# Patient Record
Sex: Male | Born: 1940 | Race: White | Hispanic: No | Smoking: Former smoker
Health system: Southern US, Community
[De-identification: ages and names within clinical notes are randomized; demographics above are authoritative.]

## PROBLEM LIST (undated history)

## (undated) DIAGNOSIS — R51 Headache: Secondary | ICD-10-CM

## (undated) DIAGNOSIS — M25519 Pain in unspecified shoulder: Secondary | ICD-10-CM

## (undated) DIAGNOSIS — M1990 Unspecified osteoarthritis, unspecified site: Secondary | ICD-10-CM

## (undated) DIAGNOSIS — K219 Gastro-esophageal reflux disease without esophagitis: Secondary | ICD-10-CM

## (undated) DIAGNOSIS — G8929 Other chronic pain: Secondary | ICD-10-CM

## (undated) DIAGNOSIS — N529 Male erectile dysfunction, unspecified: Secondary | ICD-10-CM

## (undated) DIAGNOSIS — G44209 Tension-type headache, unspecified, not intractable: Secondary | ICD-10-CM

## (undated) DIAGNOSIS — N138 Other obstructive and reflux uropathy: Secondary | ICD-10-CM

## (undated) DIAGNOSIS — N401 Enlarged prostate with lower urinary tract symptoms: Secondary | ICD-10-CM

## (undated) DIAGNOSIS — D126 Benign neoplasm of colon, unspecified: Secondary | ICD-10-CM

## (undated) DIAGNOSIS — E559 Vitamin D deficiency, unspecified: Secondary | ICD-10-CM

## (undated) DIAGNOSIS — K648 Other hemorrhoids: Secondary | ICD-10-CM

## (undated) DIAGNOSIS — H9319 Tinnitus, unspecified ear: Secondary | ICD-10-CM

## (undated) DIAGNOSIS — F419 Anxiety disorder, unspecified: Secondary | ICD-10-CM

## (undated) DIAGNOSIS — M2550 Pain in unspecified joint: Secondary | ICD-10-CM

## (undated) DIAGNOSIS — R41840 Attention and concentration deficit: Secondary | ICD-10-CM

## (undated) DIAGNOSIS — K589 Irritable bowel syndrome without diarrhea: Secondary | ICD-10-CM

## (undated) DIAGNOSIS — G4700 Insomnia, unspecified: Secondary | ICD-10-CM

## (undated) DIAGNOSIS — K449 Diaphragmatic hernia without obstruction or gangrene: Secondary | ICD-10-CM

## (undated) DIAGNOSIS — E785 Hyperlipidemia, unspecified: Secondary | ICD-10-CM

## (undated) DIAGNOSIS — K2210 Ulcer of esophagus without bleeding: Secondary | ICD-10-CM

## (undated) DIAGNOSIS — F329 Major depressive disorder, single episode, unspecified: Secondary | ICD-10-CM

## (undated) HISTORY — DX: Tension-type headache, unspecified, not intractable: G44.209

## (undated) HISTORY — PX: ROTATOR CUFF REPAIR: SHX139

## (undated) HISTORY — DX: Major depressive disorder, single episode, unspecified: F32.9

## (undated) HISTORY — DX: Tinnitus, unspecified ear: H93.19

## (undated) HISTORY — DX: Male erectile dysfunction, unspecified: N52.9

## (undated) HISTORY — DX: Pain in unspecified joint: M25.50

## (undated) HISTORY — DX: Other hemorrhoids: K64.8

## (undated) HISTORY — DX: Attention and concentration deficit: R41.840

## (undated) HISTORY — DX: Insomnia, unspecified: G47.00

## (undated) HISTORY — DX: Diaphragmatic hernia without obstruction or gangrene: K44.9

## (undated) HISTORY — DX: Irritable bowel syndrome, unspecified: K58.9

## (undated) HISTORY — DX: Benign prostatic hyperplasia with lower urinary tract symptoms: N40.1

## (undated) HISTORY — DX: Vitamin D deficiency, unspecified: E55.9

## (undated) HISTORY — DX: Hyperlipidemia, unspecified: E78.5

## (undated) HISTORY — DX: Unspecified osteoarthritis, unspecified site: M19.90

## (undated) HISTORY — DX: Gastro-esophageal reflux disease without esophagitis: K21.9

## (undated) HISTORY — DX: Other chronic pain: G89.29

## (undated) HISTORY — DX: Benign neoplasm of colon, unspecified: D12.6

## (undated) HISTORY — DX: Ulcer of esophagus without bleeding: K22.10

## (undated) HISTORY — DX: Anxiety disorder, unspecified: F41.9

## (undated) HISTORY — DX: Other obstructive and reflux uropathy: N13.8

## (undated) HISTORY — DX: Pain in unspecified shoulder: M25.519

## (undated) HISTORY — DX: Headache: R51

---
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
## 1968-09-03 HISTORY — PX: VASECTOMY: SHX75

## 1971-09-04 HISTORY — PX: KIDNEY STONE SURGERY: SHX686

## 1972-02-14 DIAGNOSIS — F329 Major depressive disorder, single episode, unspecified: Secondary | ICD-10-CM

## 1972-02-14 DIAGNOSIS — F3289 Other specified depressive episodes: Secondary | ICD-10-CM

## 1972-02-14 HISTORY — DX: Other specified depressive episodes: F32.89

## 1972-02-14 HISTORY — DX: Major depressive disorder, single episode, unspecified: F32.9

## 1978-09-03 DIAGNOSIS — H9319 Tinnitus, unspecified ear: Secondary | ICD-10-CM

## 1978-09-03 HISTORY — DX: Tinnitus, unspecified ear: H93.19

## 1994-02-13 DIAGNOSIS — G44209 Tension-type headache, unspecified, not intractable: Secondary | ICD-10-CM

## 1994-02-13 HISTORY — DX: Tension-type headache, unspecified, not intractable: G44.209

## 1998-09-14 DIAGNOSIS — K219 Gastro-esophageal reflux disease without esophagitis: Secondary | ICD-10-CM

## 1998-09-14 HISTORY — DX: Gastro-esophageal reflux disease without esophagitis: K21.9

## 1999-02-14 DIAGNOSIS — F419 Anxiety disorder, unspecified: Secondary | ICD-10-CM

## 1999-02-14 HISTORY — DX: Anxiety disorder, unspecified: F41.9

## 2000-09-03 HISTORY — PX: ANKLE FRACTURE SURGERY: SHX122

## 2000-11-08 ENCOUNTER — Emergency Department (HOSPITAL_COMMUNITY): Admission: EM | Admit: 2000-11-08 | Discharge: 2000-11-08 | Payer: Self-pay

## 2000-11-13 ENCOUNTER — Observation Stay (HOSPITAL_COMMUNITY): Admission: RE | Admit: 2000-11-13 | Discharge: 2000-11-14 | Payer: Self-pay | Admitting: Orthopedic Surgery

## 2000-11-13 ENCOUNTER — Encounter: Payer: Self-pay | Admitting: Orthopedic Surgery

## 2001-09-22 ENCOUNTER — Ambulatory Visit (HOSPITAL_COMMUNITY): Admission: RE | Admit: 2001-09-22 | Discharge: 2001-09-22 | Payer: Self-pay | Admitting: Orthopedic Surgery

## 2001-09-22 ENCOUNTER — Encounter: Payer: Self-pay | Admitting: Orthopedic Surgery

## 2001-12-05 DIAGNOSIS — M25519 Pain in unspecified shoulder: Secondary | ICD-10-CM

## 2001-12-05 HISTORY — DX: Pain in unspecified shoulder: M25.519

## 2001-12-30 ENCOUNTER — Ambulatory Visit (HOSPITAL_COMMUNITY): Admission: RE | Admit: 2001-12-30 | Discharge: 2001-12-30 | Payer: Self-pay | Admitting: Orthopedic Surgery

## 2001-12-30 ENCOUNTER — Encounter: Payer: Self-pay | Admitting: Orthopedic Surgery

## 2004-04-03 DIAGNOSIS — D126 Benign neoplasm of colon, unspecified: Secondary | ICD-10-CM

## 2004-04-03 HISTORY — DX: Benign neoplasm of colon, unspecified: D12.6

## 2005-04-04 DIAGNOSIS — E785 Hyperlipidemia, unspecified: Secondary | ICD-10-CM

## 2005-04-04 HISTORY — DX: Hyperlipidemia, unspecified: E78.5

## 2005-05-11 ENCOUNTER — Ambulatory Visit: Payer: Self-pay | Admitting: Gastroenterology

## 2005-05-15 ENCOUNTER — Ambulatory Visit: Payer: Self-pay | Admitting: Gastroenterology

## 2005-05-17 ENCOUNTER — Ambulatory Visit: Payer: Self-pay | Admitting: Internal Medicine

## 2009-03-02 ENCOUNTER — Encounter (INDEPENDENT_AMBULATORY_CARE_PROVIDER_SITE_OTHER): Payer: Self-pay | Admitting: *Deleted

## 2009-09-22 ENCOUNTER — Telehealth: Payer: Self-pay | Admitting: Gastroenterology

## 2010-10-05 NOTE — Progress Notes (Signed)
Summary: Schedule recall colon  Phone Note Outgoing Call Call back at [facility] Phone [phone]   Call placed by: Christie Nottingham CMA Duncan Dull),  September 22, 2009 3:35 PM Call placed to: Patient Summary of Call: left message for pt  to call back to schedule recall colon Initial call taken by: Christie Nottingham CMA Duncan Dull),  September 22, 2009 3:35 PM  Follow-up for Phone Call        Pt. returned call and is having a physical done next month and will call back once that is done.  Follow-up by: Christie Nottingham CMA Duncan Dull),  September 22, 2009 4:49 PM

## 2010-10-09 ENCOUNTER — Other Ambulatory Visit (HOSPITAL_COMMUNITY): Payer: Medicare Other

## 2010-10-10 ENCOUNTER — Encounter (HOSPITAL_COMMUNITY): Payer: No Typology Code available for payment source

## 2010-10-10 ENCOUNTER — Ambulatory Visit (HOSPITAL_COMMUNITY)
Admission: RE | Admit: 2010-10-10 | Discharge: 2010-10-10 | Disposition: A | Discharge status: Home / Self Care | Payer: No Typology Code available for payment source | Source: Ambulatory Visit | Attending: Orthopedic Surgery | Admitting: Orthopedic Surgery

## 2010-10-10 DIAGNOSIS — Z01818 Encounter for other preprocedural examination: Secondary | ICD-10-CM | POA: Insufficient documentation

## 2010-10-10 DIAGNOSIS — M719 Bursopathy, unspecified: Secondary | ICD-10-CM | POA: Insufficient documentation

## 2010-10-10 DIAGNOSIS — M67919 Unspecified disorder of synovium and tendon, unspecified shoulder: Secondary | ICD-10-CM | POA: Insufficient documentation

## 2010-10-10 LAB — CBC
MCH: 30.3 pg (ref 26.0–34.0)
MCV: 90.2 fL (ref 78.0–100.0)
WBC: 13.4 10*3/uL — ABNORMAL HIGH (ref 4.0–10.5)

## 2010-10-10 LAB — DIFFERENTIAL
Basophils Absolute: 0 10*3/uL (ref 0.0–0.1)
Eosinophils Absolute: 0 10*3/uL (ref 0.0–0.7)
Neutro Abs: 11 10*3/uL — ABNORMAL HIGH (ref 1.7–7.7)
Neutrophils Relative %: 82 % — ABNORMAL HIGH (ref 43–77)

## 2010-10-10 LAB — COMPREHENSIVE METABOLIC PANEL
Alkaline Phosphatase: 53 U/L (ref 39–117)
BUN: 13 mg/dL (ref 6–23)
Chloride: 103 mEq/L (ref 96–112)
Creatinine, Ser: 1.36 mg/dL (ref 0.4–1.5)
Glucose, Bld: 103 mg/dL — ABNORMAL HIGH (ref 70–99)
Potassium: 4.2 mEq/L (ref 3.5–5.1)
Total Bilirubin: 0.5 mg/dL (ref 0.3–1.2)
Total Protein: 7.2 g/dL (ref 6.0–8.3)

## 2010-10-10 LAB — URINALYSIS, ROUTINE W REFLEX MICROSCOPIC
Protein, ur: NEGATIVE mg/dL
Specific Gravity, Urine: 1.02 (ref 1.005–1.030)
Urobilinogen, UA: 0.2 mg/dL (ref 0.0–1.0)

## 2010-10-10 LAB — SURGICAL PCR SCREEN: Staphylococcus aureus: NEGATIVE

## 2010-10-11 ENCOUNTER — Observation Stay (HOSPITAL_COMMUNITY)
Admission: RE | Admit: 2010-10-11 | Discharge: 2010-10-12 | Disposition: A | Discharge status: Home / Self Care | Payer: Medicare Other | Source: Ambulatory Visit | Attending: Orthopedic Surgery | Admitting: Orthopedic Surgery

## 2010-10-11 DIAGNOSIS — S43429A Sprain of unspecified rotator cuff capsule, initial encounter: Principal | ICD-10-CM | POA: Insufficient documentation

## 2010-10-11 DIAGNOSIS — Z01812 Encounter for preprocedural laboratory examination: Secondary | ICD-10-CM | POA: Insufficient documentation

## 2010-10-11 DIAGNOSIS — Z01810 Encounter for preprocedural cardiovascular examination: Secondary | ICD-10-CM | POA: Insufficient documentation

## 2010-10-11 DIAGNOSIS — M25819 Other specified joint disorders, unspecified shoulder: Secondary | ICD-10-CM | POA: Insufficient documentation

## 2010-10-16 NOTE — Op Note (Addendum)
  NAME:  KENDRYCK, LACROIX NO.:  [id number]  MEDICAL RECORD NO.:  [id number]  LOCATION:  XRAY                         FACILITY:  [facility]  PHYSICIAN:  Georges Lynch. Reeve Turnley, M.D.DATE OF BIRTH:  1941/06/30  DATE OF PROCEDURE:  10/11/2010 DATE OF DISCHARGE:  10/10/2010                              OPERATIVE REPORT   SURGEON:  Windy Fast A. Darrelyn Hillock, M.D.  ASSISTANT:  Rozell Searing, [facility]  PREOPERATIVE DIAGNOSES: 1. Complex tear of rotator cuff tendon left shoulder. 2. Impingement syndrome of the left shoulder.  POSTOPERATIVE DIAGNOSES: 1. Complex tear of rotator cuff tendon left shoulder. 2. Impingement syndrome of the left shoulder.  OPERATION: 1. Open acromionectomy left shoulder. 2. Repair of a complex tear of the rotator cuff tendon on the left     utilizing a TissueMend graft and one anchor. 3. Excision of the subdeltoid bursa.  OPERATIVE NOTE:  The appropriate time-out was carried out in the operating room prior to surgery.  Prior to bringing the patient back to surgery, I marked the appropriate left arm.  After sterile prepping and draping was carried out with the patient in the beach-chair position, an incision was made over the anterior aspect of the left shoulder. Bleeders were identified and cauterized.  At this time, I then stripped the deltoid tendon by sharp dissection from the acromion.  The acromion was quite thickened and down sloped.  Immediately upon detaching the tendon, I noticed that fluid came up out of the joint.  There was a large hole in the tendon.  Basically the tendon was rather shredded as well.  We protected the tendon and then inserted the Bennett retractor to protect the underlying tendon and then utilized the oscillating saw to do an open acromionectomy and then the bur to do an acromioplasty. Once the acromion was evened out and we reestablished the subacromial space, I then irrigated the area out, bone waxed the undersurface of  the acromion and then repaired the rotator cuff in the usual fashion.  I did utilize a TissueMend graft with one anchor to support the repair.  I thoroughly irrigated out the area and reattached the deltoid tendon with #1 Ethibond suture.  The remaining part of the muscle was closed with 0 Vicryl, the subcutaneous with 0 Vicryl and the skin with metal staples.  Sterile Neosporin dressing was applied.  He was then placed in a shoulder immobilizer.  He had 1 gram of IV Ancef preoperatively.          ______________________________ Georges Lynch Darrelyn Hillock, M.D.     RAG/MEDQ  D:  10/11/2010  T:  10/12/2010  Job:  [id number]  cc:   Windy Fast A. Darrelyn Hillock, M.D. Fax: [phone]  Electronically Signed by Ranee Gosselin M.D. on 03/10/2011 08:18:05 AM

## 2010-12-18 ENCOUNTER — Telehealth: Payer: Self-pay | Admitting: Gastroenterology

## 2010-12-18 ENCOUNTER — Ambulatory Visit (AMBULATORY_SURGERY_CENTER): Payer: Medicare Other | Admitting: *Deleted

## 2010-12-18 VITALS — Ht 69.0 in | Wt 178.2 lb

## 2010-12-18 DIAGNOSIS — Z86010 Personal history of colonic polyps: Secondary | ICD-10-CM

## 2010-12-18 MED ORDER — PEG-KCL-NACL-NASULF-NA ASC-C 100 G PO SOLR: ORAL | Status: DC

## 2010-12-18 NOTE — Telephone Encounter (Signed)
Pt did not understand that he would have to change procedure date to receive Propofol.  Dates and reason for changing to Propofol reviewed with patient.  He agrees to proceed with planned procedure.

## 2010-12-27 ENCOUNTER — Other Ambulatory Visit: Payer: Self-pay | Admitting: Gastroenterology

## 2011-01-22 NOTE — Discharge Summary (Addendum)
  NAME:  Logan Hayes, Logan Hayes NO.:  [id number]  MEDICAL RECORD NO.:  [id number]  LOCATION:  XRAY                         FACILITY:  [facility]  PHYSICIAN:  Georges Lynch. Emorie Mcfate, M.D.DATE OF BIRTH:  [birth year]  DATE OF ADMISSION:  10/10/2010 DATE OF DISCHARGE:  10/10/2010                              DISCHARGE SUMMARY   ADMITTING DIAGNOSIS:  Less rotator cuff tear.  DISCHARGE DIAGNOSIS:  Less rotator cuff tear.  LABORATORY DATA:  Preoperative CBC revealed white count of 13.4, hemoglobin of 15.4, hematocrit 45.8 and a platelet count of 220,000. Preoperative INR of 1.05.  The patient took aspirin preoperatively. Preoperative chemistry panel revealed very slightly elevated glucose at 103.  Preoperative urinalysis negative for infection and preoperative PCR for Staph aureus and MRSA are both negative.  PROCEDURE:  On October 11, 2010, [name] was taken to the operating room by surgeon Dr. Ranee Gosselin, assistant Rozell Searing PA-C.  He underwent open acromionectomy of the left shoulder and also repair of complex tear of the rotator cuff tendon utilizing TissueMend graft and one anchor and there was also excision of the subdeltoid bursa on the left shoulder.  The procedure was performed under general anesthesia.  Routine orthopedic prep and drape were carried out.  The patient was returned to the recovery room in satisfactory condition.  HOSPITAL COURSE:  Patient was admitted to [facility].  On October 12, 2010, he underwent the above-stated procedure without complication.  After adequate time in the recovery room, he was taken to the orthopedic floor.  He was on reduced dose of PCA Dilaudid and muscle relaxant for pain control.  The patient was placed in a sling.  He was also given sling instructions.  On postoperative day 1, the patient continued to progress.  Pain is well controlled with analgesics.  He will go home on postoperative day 1, October 12, 2010.  DISPOSITION:  Home on October 12, 2010.  MEDICATIONS ON DISCHARGE: 1. Prilosec. 2. Timolol ophthalmic. 3. Percocet. 4. Amitriptyline. 5. Xanax. 6. Amitriptyline. 7. Finasteride. 8. Crestor. 9. Aspirin. 10.Vitamin D3. 11.Robaxin.  SPECIAL INSTRUCTIONS:  Patient to wear the sling at all times except when showering.  He is not to work on any type of range of motion.  DIET:  No restrictions.  WOUND CARE:  Daily dressing change.  FOLLOWUP:  He will follow up with Dr. Darrelyn Hillock in the office 2 weeks from the day of surgery, should contact the office at [phone] to schedule an appointment.  CONDITION ON DISCHARGE:  Improving.     Rozell Searing, PAC   ______________________________ Georges Lynch Darrelyn Hillock, M.D.    LD/MEDQ  D:  01/19/2011  T:  01/19/2011  Job:  [id number]  Electronically Signed by Rozell Searing  on 02/14/2011 09:39:37 AM Electronically Signed by Ranee Gosselin M.D. on 03/10/2011 08:18:03 AM

## 2011-01-30 ENCOUNTER — Encounter: Payer: Self-pay | Admitting: Gastroenterology

## 2012-03-12 ENCOUNTER — Encounter: Payer: Self-pay | Admitting: Gastroenterology

## 2012-03-17 ENCOUNTER — Ambulatory Visit (INDEPENDENT_AMBULATORY_CARE_PROVIDER_SITE_OTHER): Payer: PRIVATE HEALTH INSURANCE | Admitting: Gastroenterology

## 2012-03-17 ENCOUNTER — Encounter: Payer: Self-pay | Admitting: Gastroenterology

## 2012-03-17 VITALS — BP 132/86 | HR 84 | Ht 69.0 in | Wt 194.2 lb

## 2012-03-17 DIAGNOSIS — R198 Other specified symptoms and signs involving the digestive system and abdomen: Secondary | ICD-10-CM

## 2012-03-17 DIAGNOSIS — Z86010 Personal history of colonic polyps: Secondary | ICD-10-CM

## 2012-03-17 DIAGNOSIS — R109 Unspecified abdominal pain: Secondary | ICD-10-CM

## 2012-03-17 MED ORDER — MOVIPREP 100 G PO SOLR: 1.0000 | Freq: Once | ORAL | Status: DC

## 2012-03-17 NOTE — Progress Notes (Signed)
History of Present Illness: This is a 71 year old male who has noted a change in bowel habits over the past several months associated with periumbilical discomfort and bloating. He generally has constipation but sometimes has loose bowel movements. He began taking MiraLax on a daily basis several weeks ago and now has 2-3 bowel movements each day and his abdominal pain has subsided. He is over due for surveillance colonoscopy for history of adenomatous colon polyps. Denies weight loss, change in stool caliber, melena, hematochezia, nausea, vomiting, dysphagia, reflux symptoms, chest pain.  No Known Allergies Outpatient Prescriptions Prior to Visit  Medication Sig Dispense Refill  . ALPRAZolam (XANAX) 1 MG tablet Take 1 mg by mouth as needed. Takes as needed for anxiety       . amitriptyline (ELAVIL) 50 MG tablet Take 50 mg by mouth. One in the morning and 2 at bedtime      . Cholecalciferol (VITAMIN D-3 PO) Take by mouth.        . CRESTOR 10 MG tablet Take 10 mg by mouth Daily.      . finasteride (PROSCAR) 5 MG tablet Take 5 mg by mouth Daily.      Marland Kitchen HYDROcodone-acetaminophen (VICODIN) 5-500 MG per tablet Take 5-500 mg by mouth 4 (four) times daily. For pain      . omeprazole (PRILOSEC OTC) 20 MG tablet Take 20 mg by mouth daily.        Marland Kitchen oxyCODONE (OXY IR/ROXICODONE) 5 MG immediate release tablet Take 5 mg by mouth every 4 (four) hours as needed.        . peg 3350 powder (MOVIPREP) 100 G SOLR MOVIPREP AS DIRECTED  1 kit  0   Past Medical History  Diagnosis Date  . Anxiety   . Arthritis   . GERD (gastroesophageal reflux disease)   . Glaucoma   . Hyperlipidemia   . Chronic joint pain   . IBS (irritable bowel syndrome)   . Erosive esophagitis   . Hiatal hernia   . Internal hemorrhoid   . Adenomatous polyp of colon 04/2004   Past Surgical History  Procedure Date  . Ankle fracture surgery     left  . Rotator cuff repair     left  . Vasectomy    History   Social History  . Marital  Status: Single    Spouse Name: N/A    Number of Children: 1  . Years of Education: N/A   Occupational History  . retired    Social History Main Topics  . Smoking status: Former Smoker    Quit date: 08/10/1979  . Smokeless tobacco: Never Used  . Alcohol Use: No  . Drug Use: No  . Sexually Active: None   Other Topics Concern  . None   Social History Narrative  . None   History reviewed. No pertinent family history.   Review of Systems: Pertinent positive and negative review of systems were noted in the above HPI section. All other review of systems were otherwise negative.  Physical Exam: General: Well developed , well nourished, no acute distress Head: Normocephalic and atraumatic Eyes:  sclerae anicteric, EOMI Ears: Normal auditory acuity Mouth: No deformity or lesions Neck: Supple, no masses or thyromegaly Lungs: Clear throughout to auscultation Heart: Regular rate and rhythm; no murmurs, rubs or bruits Abdomen: Soft, mild tenderness to deep palpation just above the umbilicus in the midline, without rebound or guarding and non distended. No masses, hepatosplenomegaly or hernias noted. Normal Bowel sounds Rectal: deferred  to colonoscopy Musculoskeletal: Symmetrical with no gross deformities  Skin: No lesions on visible extremities Pulses:  Normal pulses noted Extremities: No clubbing, cyanosis, edema or deformities noted Neurological: Alert oriented x 4, grossly nonfocal Cervical Nodes:  No significant cervical adenopathy Inguinal Nodes: No significant inguinal adenopathy Psychological:  Alert and cooperative. Normal mood and affect  Assessment and Recommendations:  1. History of irritable bowel syndrome with a change in bowel habits, with predominantly constipation, and periumbilical abdominal pain. Continue MiraLax once every other day to twice daily titrated for adequate bowel movements. Consider the addition of antispasmodic if his symptoms aren't adequately  controlled. Further evaluation with colonoscopy. The risks, benefits, and alternatives to colonoscopy with possible biopsy and possible polypectomy were discussed with the patient and they consent to proceed.   2. Personal history of adenomatous colon polyps, initial diagnosis 2005. He is overdue for surveillance colonoscopy. Colonoscopy as above.

## 2012-03-17 NOTE — Patient Instructions (Addendum)
You have been scheduled for a colonoscopy with propofol. Please follow written instructions given to you at your visit today.  Please pick up your prep kit at the pharmacy within the next 1-3 days. If you use inhalers (even only as needed), please bring them with you on the day of your procedure. cc: Murray Hodgkins, MD

## 2012-03-18 ENCOUNTER — Encounter: Payer: Self-pay | Admitting: Gastroenterology

## 2012-03-18 ENCOUNTER — Ambulatory Visit (AMBULATORY_SURGERY_CENTER): Payer: Medicare Other | Admitting: Gastroenterology

## 2012-03-18 VITALS — BP 158/99 | HR 97 | Temp 97.2°F | Resp 22 | Ht 69.0 in | Wt 194.0 lb

## 2012-03-18 DIAGNOSIS — D126 Benign neoplasm of colon, unspecified: Secondary | ICD-10-CM

## 2012-03-18 DIAGNOSIS — Z86010 Personal history of colonic polyps: Secondary | ICD-10-CM

## 2012-03-18 DIAGNOSIS — R198 Other specified symptoms and signs involving the digestive system and abdomen: Secondary | ICD-10-CM

## 2012-03-18 DIAGNOSIS — R109 Unspecified abdominal pain: Secondary | ICD-10-CM

## 2012-03-18 LAB — HM COLONOSCOPY

## 2012-03-18 MED ORDER — SODIUM CHLORIDE 0.9 % IV SOLN: 500.0000 mL | INTRAVENOUS | Status: DC

## 2012-03-18 MED ORDER — HYOSCYAMINE SULFATE 0.125 MG SL SUBL: 0.1250 mg | SUBLINGUAL_TABLET | SUBLINGUAL | Status: DC | PRN

## 2012-03-18 NOTE — Progress Notes (Signed)
Patient did not experience any of the following events: a burn prior to discharge; a fall within the facility; wrong site/side/patient/procedure/implant event; or a hospital transfer or hospital admission upon discharge from the facility. (G8907) Patient did not have preoperative order for IV antibiotic SSI prophylaxis. (G8918)  

## 2012-03-18 NOTE — Op Note (Signed)
[facility] [address] [place], [place]  [address]  COLONOSCOPY PROCEDURE REPORT PATIENT:  Logan Hayes, Logan Hayes  MR#:  [id number] BIRTHDATE:  [birth year], 71 yrs. old  GENDER:  male ENDOSCOPIST:  Judie Petit T. Russella Dar, Logan Hayes, [facility]  PROCEDURE DATE:  03/18/2012 PROCEDURE:  Colonoscopy with snare polypectomy ASA CLASS:  Class II INDICATIONS:  1) surveillance and high-risk screening  2) change in bowel habits  3) history of adenomatous colon polyps: 2005 MEDICATIONS:   MAC sedation, administered by CRNA, propofol (Diprivan) 150 mg IV DESCRIPTION OF PROCEDURE:   After the risks benefits and alternatives of the procedure were thoroughly explained, informed consent was obtained.  Digital rectal exam was performed and revealed no abnormalities.   The LB CF-[id number] endoscope was introduced through the anus and advanced to the cecum, which was identified by both the appendix and ileocecal valve, without limitations.  The quality of the prep was good, using MoviPrep. The instrument was then slowly withdrawn as the colon was fully examined. <<PROCEDUREIMAGES>> FINDINGS:  A sessile polyp was found in the ascending colon. It was 5 mm in size. Polyp was snared without cautery. Retrieval was successful.  A sessile polyp was found at the hepatic flexure. It was 4 mm in size. Polyp was snared without cautery. Retrieval was successful. Two polyps were found in the transverse colon. They were 5-6 mm in size. Polyps were snared without cautery. Retrieval was successful. Otherwise normal colonoscopy without other polyps, masses, vascular ectasias, or inflammatory changes. Retroflexed views in the rectum revealed internal hemorrhoids, small. The time to cecum = 2.5 minutes. The scope was then withdrawn (time =10.74min) from the patient and the procedure completed.  COMPLICATIONS:  None  ENDOSCOPIC IMPRESSION: 1) 5 mm sessile polyp in the ascending colon 2) 4 mm sessile polyp at the hepatic  flexure 3) 5 - 6 mm Two polyps in the transverse colon 4) Internal hemorrhoids  RECOMMENDATIONS: 1) Await pathology results 2) Repeat Colonoscopy in 5 years.  Logan Lick. Russella Dar, Logan Hayes, Logan Hayes  CC:  Murray Hodgkins, Logan Hayes  n. Logan DoctorVenita Lick. Logan Hayes at 03/18/2012 03:35 PM  Logan Hayes, [phone]

## 2012-03-18 NOTE — Patient Instructions (Addendum)

## 2012-03-19 ENCOUNTER — Telehealth: Payer: Self-pay | Admitting: *Deleted

## 2012-03-19 NOTE — Telephone Encounter (Signed)
No answer. Time [employer] not setup. Unable to leave message.

## 2012-03-24 ENCOUNTER — Encounter: Payer: Self-pay | Admitting: Gastroenterology

## 2012-05-05 ENCOUNTER — Other Ambulatory Visit: Payer: Self-pay | Admitting: Gastroenterology

## 2012-05-09 ENCOUNTER — Telehealth: Payer: Self-pay | Admitting: Gastroenterology

## 2012-05-09 NOTE — Telephone Encounter (Signed)
Agree 

## 2012-05-09 NOTE — Telephone Encounter (Signed)
Patient reports that he is having constipation since colonoscopy last week.  He is having BMs, but they are small.  I have reviewed the note from the last office visit and the patient had the same complaints then.  He has not resumed his Miralax.  I have asked him to increase the amount of fluid he is drinking, remain on a high fiber diet, and resume taking Miralax 1-2 times a day.  He denies other complaints.  He will call back for any additional questions or concerns

## 2013-01-09 ENCOUNTER — Other Ambulatory Visit: Payer: Self-pay | Admitting: *Deleted

## 2013-01-09 DIAGNOSIS — R7989 Other specified abnormal findings of blood chemistry: Secondary | ICD-10-CM

## 2013-01-09 DIAGNOSIS — R413 Other amnesia: Secondary | ICD-10-CM

## 2013-01-09 DIAGNOSIS — E785 Hyperlipidemia, unspecified: Secondary | ICD-10-CM

## 2013-02-04 ENCOUNTER — Other Ambulatory Visit: Payer: Self-pay | Admitting: Internal Medicine

## 2013-03-04 ENCOUNTER — Other Ambulatory Visit: Payer: Self-pay | Admitting: Internal Medicine

## 2013-03-18 ENCOUNTER — Other Ambulatory Visit: Payer: Medicare Other

## 2013-03-18 DIAGNOSIS — R7989 Other specified abnormal findings of blood chemistry: Secondary | ICD-10-CM

## 2013-03-18 DIAGNOSIS — R413 Other amnesia: Secondary | ICD-10-CM

## 2013-03-18 DIAGNOSIS — E785 Hyperlipidemia, unspecified: Secondary | ICD-10-CM

## 2013-03-19 LAB — COMPREHENSIVE METABOLIC PANEL
ALT: 23 IU/L (ref 0–44)
Albumin: 4.7 g/dL (ref 3.5–4.8)
BUN: 15 mg/dL (ref 8–27)
CO2: 21 mmol/L (ref 18–29)
Chloride: 105 mmol/L (ref 97–108)
Glucose: 101 mg/dL — ABNORMAL HIGH (ref 65–99)
Potassium: 4.2 mmol/L (ref 3.5–5.2)
Total Bilirubin: 0.4 mg/dL (ref 0.0–1.2)
Total Protein: 7.1 g/dL (ref 6.0–8.5)

## 2013-03-19 LAB — LIPID PANEL
Cholesterol, Total: 153 mg/dL (ref 100–199)
LDL Calculated: 74 mg/dL (ref 0–99)
Triglycerides: 158 mg/dL — ABNORMAL HIGH (ref 0–149)
VLDL Cholesterol Cal: 32 mg/dL (ref 5–40)

## 2013-03-23 ENCOUNTER — Other Ambulatory Visit: Payer: Self-pay

## 2013-03-25 ENCOUNTER — Encounter: Payer: Self-pay | Admitting: *Deleted

## 2013-03-25 ENCOUNTER — Encounter: Payer: Self-pay | Admitting: Internal Medicine

## 2013-03-25 ENCOUNTER — Ambulatory Visit (INDEPENDENT_AMBULATORY_CARE_PROVIDER_SITE_OTHER): Payer: Medicare Other | Admitting: Internal Medicine

## 2013-03-25 VITALS — BP 112/80 | HR 84 | Temp 99.3°F | Resp 18 | Ht 68.5 in | Wt 192.4 lb

## 2013-03-25 DIAGNOSIS — E785 Hyperlipidemia, unspecified: Secondary | ICD-10-CM

## 2013-03-25 DIAGNOSIS — H9319 Tinnitus, unspecified ear: Secondary | ICD-10-CM

## 2013-03-25 DIAGNOSIS — F3289 Other specified depressive episodes: Secondary | ICD-10-CM

## 2013-03-25 DIAGNOSIS — K589 Irritable bowel syndrome without diarrhea: Secondary | ICD-10-CM

## 2013-03-25 DIAGNOSIS — F419 Anxiety disorder, unspecified: Secondary | ICD-10-CM

## 2013-03-25 DIAGNOSIS — F329 Major depressive disorder, single episode, unspecified: Secondary | ICD-10-CM

## 2013-03-25 DIAGNOSIS — M25519 Pain in unspecified shoulder: Secondary | ICD-10-CM

## 2013-03-25 DIAGNOSIS — K219 Gastro-esophageal reflux disease without esophagitis: Secondary | ICD-10-CM

## 2013-03-25 DIAGNOSIS — F411 Generalized anxiety disorder: Secondary | ICD-10-CM

## 2013-03-25 MED ORDER — HYDROCODONE-ACETAMINOPHEN 7.5-325 MG PO TABS: 1.0000 | ORAL_TABLET | Freq: Four times a day (QID) | ORAL | Status: DC | PRN

## 2013-03-25 NOTE — Patient Instructions (Signed)
Try A&D ointment to the groin and in the buttocks crack to improve rash. Vaseline lip ointment will help the lips.

## 2013-03-25 NOTE — Progress Notes (Signed)
MRN: [id number] Name: Logan Hayes  Sex: male Age: 72 y.o. DOB: 1941-01-25  [facility] #: [id number]  Level Of Care: independent Provider: Kimber [facility] Emergency Contacts: Extended Emergency Contact Information Primary Emergency Contact: Ellin Saba [place] Home Phone: [phone] Relation: Brother Secondary Emergency Contact: Annye Rusk [place] Home Phone: [phone] Relation: Son  Code Status: Full  Allergies: Mirtazapine  Chief Complaint  Patient presents with  . Annual Exam    medication questions    HPI: Patient is 72 y.o. male who is here for complete exam and review of his medical problems  Anxiety: Chronic. Stable. Using current medications to assist in management.  GERD (gastroesophageal reflux disease): Generally under good control.  Hyperlipidemia: Controlled  IBS (irritable bowel syndrome): Asymptomatic at this time.  [name], shoulder region, unspecified laterality: unchanged from previous examinations  Unspecified tinnitus: Present for more than 20 years. Associated with his anxiety and social withdrawal.  Patient reports a chafed rash in the inner gluteal area. He believes that riding his motorcycle has created this problem   Past Medical History  Diagnosis Date  . Anxiety 02/14/1999  . Arthritis   . GERD (gastroesophageal reflux disease) 09/14/1998  . Glaucoma 02/13/2006  . Hyperlipidemia 04/04/2005  . Chronic joint pain   . IBS (irritable bowel syndrome)   . Erosive esophagitis   . Hiatal hernia   . Internal hemorrhoid   . Adenomatous polyp of colon 04/2004  . [name], shoulder region 12/05/2001  . Tension headache 02/13/1994  . Depressive disorder, not elsewhere classified 02/14/1972  . Unspecified tinnitus 05-29-1941  . Unspecified vitamin D deficiency   . Hypertrophy of prostate with urinary obstruction and other lower urinary tract symptoms (LUTS)   . Impotence of organic  origin   . Insomnia, unspecified   . Attention or concentration deficit     Past Surgical History  Procedure Laterality Date  . Ankle fracture surgery Left 2002    Dr. Darrelyn Hillock  . Rotator cuff repair      left  . Vasectomy  1970  . Kidney stone surgery  1973   PAST PROCEDURES PERFORMED:  1973- IVP: Possible stone left ureter 1992- Flex sig: Normal 6/93 -ETT: Normal  1993- EGD: Hiatal hernia with GERD; Dr. Russella Dar 2/94 -MRI brain and internal auditory canals: Normal 11/98- Flex sig: Normal 1/03- CXR: Neg. 1/05- CT abd: left parapelvic cyst 7/05- Colonoscopy 2006- MRI @ [facility]  of [facility] 05-11-2005- Endoscopy-hiatal hernia Dr. Ellin Goodie  Psychiatry: Dr. Laurelyn Sickle  Urology: Vonita Moss  Gastroenterology: Russella Dar  Orthopedics: Doroteo Glassman, Otelia Sergeant  ENT Narda Bonds  Ophthalmology: Dennison Mascot. surgery:  Kend[facility] Ranch      Medication List       This list is accurate as of: 03/25/13  2:21 PM.  Always use your most recent med list.               ALPRAZolam 1 MG tablet  Commonly known as:  XANAX  Take 1 mg by mouth as needed. Take 1 tablet up to three times daily for nerves.     amitriptyline 50 MG tablet  Commonly known as:  ELAVIL  TAKE 1 TABLET DURING THE DAY AND 2 TABLETS AT BEDTIME     aspirin 81 MG tablet  Take 81 mg by mouth daily. Take 1 tablet daily to prevent heart attack and stroke.     CRESTOR 10 MG tablet  Generic drug:  rosuvastatin  TAKE 1 TABLET  EVERY DAY TO LOWER CHOLESTEROL     FIBER FORMULA PO  Take 625 mg by mouth 2 (two) times daily. Take 1 tablet twice daily.     finasteride 5 MG tablet  Commonly known as:  PROSCAR  Take 5 mg by mouth Daily. Take 1 tablet once daily for urine dribbling/prostate enlargement.     HYDROcodone-acetaminophen 5-500 MG per tablet  Commonly known as:  VICODIN  Take 5-500 mg by mouth 4 (four) times daily. Take 1 tablet by mouth up to four times daily as needed for pain.     hyoscyamine 0.125 MG SL tablet  Commonly  known as:  LEVSIN SL  Place 1 tablet (0.125 mg total) under the tongue every 4 (four) hours as needed for cramping (1-2 every 4 hours as needed for abdominal pain).     omeprazole 20 MG tablet  Commonly known as:  PRILOSEC OTC  Take 20 mg by mouth daily. Take 1 tablet once daily for GERD.     oxyCODONE 5 MG immediate release tablet  Commonly known as:  Oxy IR/ROXICODONE  Take 5 mg by mouth every 4 (four) hours as needed. Take 1 tablet up to 4 times in 24 hours to control pain.     polyethylene glycol packet  Commonly known as:  MIRALAX / GLYCOLAX  Take 17 g by mouth daily.     Prednicarbate 0.1 % Crea  Apply topically. Apply sparingly to involved areas of face.     TIMOPTIC-XE 0.5 % ophthalmic gel-forming  Generic drug:  timolol  Place 1 drop into the right eye daily. Instill 1 drop into right eye once daily.     VITAMIN D-3 PO  Take by mouth.          Immunization History  Administered Date(s) Administered  . Pneumococcal Conjugate 03/11/2012  . Td 09/14/1998, 03/14/2007    History  Substance Use Topics  . Smoking status: Former Smoker    Quit date: 08/10/1979  . Smokeless tobacco: Never Used  . Alcohol Use: No  MARITAL HISTORY:   Divorced. HOUSING: The patient lives in a single level home. PERSONS IN HOME:  The patient lives alone. LIVING WILL:  The patient does not have a living will. OCCUPATION:    disability TOBACCO USE:   Stopped.-08-07-1979 ALCOHOL:   Does not give any significant history of alcohol usage. none CAFFEINE:  Does not use caffeinated beverages. EXERCISES:   The patient is not exercising regularly. DIET:  Follows no specific diet. STRESS ISSUES:    PETS IN HOME:  The patient has no pets. ILLICIT DRUG USE:  Denies illicit drug use.  FAMILY HISTORY:   FATHER:    The father is deceased.01/28/06, age 93, dementia, glaucoma, HBD, CABG, Subdural hematoma MOTHER:   The mother is deceased. Age 23. Leukemia, DM SIBLINGS:   1)   Jerry Living 65 2)    Rayna Sexton Living 64 3)   John Living  4)   Onalee Hua Living; IDDM, obese 5)   Roger Living 6)   Lora Living 7)  Betty Living 8)  Darl Pikes Living 9)  Deborah Living 10) Alanta Living Brother has HBP. Sister has skin cancer and DM. Another sister with cervical cancer. Nephew with DM. CHILDREN:   1)  The patient's son is living.: alcoholic, kidney stones Cian  Review of Systems  DATA OBTAINED: from patient, medical record GENERAL: Feels well no fevers, fatigue, appetite changes SKIN: No itching, or wounds. Rash in the intergluteal area. EYES: No eye pain,  redness, discharge EARS: No earache. Has chronic tinnitus. Loss of hearing bilaterally.  NOSE: No congestion, drainage or bleeding  MOUTH/THROAT: No mouth or tooth pain, No sore throat, No difficulty chewing or swallowing  RESPIRATORY: No cough, wheezing, SOB CARDIAC: No chest pain, palpitations, lower extremity edema  GI: No abdominal pain, No N/V/D. Chronic intermittent constipation. No heartburn or reflux  GU: Slow, weak urinary stream. MUSCULOSKELETAL: Chronic pain right ankle. Chronic pain shoulders. NEUROLOGIC: Awake, alert, appropriate to situation, No change in mental status. Moves all four, no focal deficits. Episodes of dizziness when bending over and then righting himself. He believes his memory is getting worse. PSYCHIATRIC: No overt anxiety or sadness. Sleeps well. No behavior issue.  AMBULATION:  Normal.  Filed Vitals:   03/25/13 1327  BP: 112/80  Pulse: 84  Temp: 99.3 F (37.4 C)  Resp: 18    Physical Exam  GENERAL APPEARANCE: Alert, conversant. Appropriately groomed. No acute distress.  SKIN: No diaphoresis or wounds. There is unchanged rash in the intergluteal area. HEAD: Normocephalic, atraumatic  EYES: Conjunctiva/lids clear. Pupils round, reactive. EOMs intact.  EARS: External exam WNL, canals clear. Partial hearing loss bilaterally.  NOSE: No deformity or discharge.  MOUTH/THROAT: Lips w/o lesions. Mouth and  throat normal. Tongue moist, w/o lesion.  NECK: No thyroid tenderness, enlargement or nodule  RESPIRATORY: Breathing is even, unlabored. Lung sounds are clear   CARDIOVASCULAR: Heart RRR no murmurs, rubs or gallops. No peripheral edema.  ARTERIAL: radial pulse 2+, DP pulse 1+  VENOUS: No varicosities. No venous stasis skin changes  GASTROINTESTINAL: Abdomen is soft, non-tender, not distended w/ normal bowel sounds. No mass, ventral or inguinal hernia. No organomegally GENITOURINARY: Bladder non tender, not distended  MUSCULOSKELETAL: No abnormal joints or musculature NEUROLOGIC: Oriented X3. Cranial nerves 2-12 grossly intact. Moves all extremities no tremor. PSYCHIATRIC: Mood and affect appropriate to situation, no behavioral issues MMSE: 12/06/2010  total 27/30; past clock drawing    FUNCTIONAL ASSESSMENT ASSESSMENT  Independent in all ADL   03/10/2012  CBC: wbc 7.1, rbc 4.89, Hemoglobin 15.1 CMP: glucose 91, BUN 13, Creatinine 1.25 A1C: 5.8 Lipid: Cholesterol 176, Triglycerides 247, HDL 53, LDL 74 Urine Microalbumin 3.6 09/16/12 BMP; Glucose 104, BUN 14, Creatinine 1.57 A1c 5.9 Lipid Panel; Cholesterol 181, Triglycerides 284 HDL 42, LDL 82  CBC    Component Value Date/Time   WBC 13.4* 10/10/2010 1415   RBC 5.08 10/10/2010 1415   HGB 15.4 10/10/2010 1415   HCT 45.8 10/10/2010 1415   PLT 220 10/10/2010 1415   MCV 90.2 10/10/2010 1415   LYMPHSABS 1.0 10/10/2010 1415   MONOABS 1.3* 10/10/2010 1415   EOSABS 0.0 10/10/2010 1415   BASOSABS 0.0 10/10/2010 1415    CMP     Component Value Date/Time   NA 140 03/18/2013 1017   NA 138 10/10/2010 1415   K 4.2 03/18/2013 1017   CL 105 03/18/2013 1017   CO2 21 03/18/2013 1017   GLUCOSE 101* 03/18/2013 1017   GLUCOSE 103* 10/10/2010 1415   BUN 15 03/18/2013 1017   BUN 13 10/10/2010 1415   CREATININE 1.63* 03/18/2013 1017   CALCIUM 9.8 03/18/2013 1017   PROT 7.1 03/18/2013 1017   PROT 7.2 10/10/2010 1415   ALBUMIN 4.2 10/10/2010 1415   AST 21 03/18/2013 1017    ALT 23 03/18/2013 1017   ALKPHOS 54 03/18/2013 1017   BILITOT 0.4 03/18/2013 1017   GFRNONAA 42* 03/18/2013 1017   GFRAA 49* 03/18/2013 1017    Assessment and Plan  Anxiety: Chronic. Continue current medications  GERD (gastroesophageal reflux disease): Chronic. Continue current medication.  Hyperlipidemia: Controlled  IBS (irritable bowel syndrome): Asymptomatic  [name], shoulder region, unspecified laterality: Chronic issue currently controlled with hydrocodone   - Plan: HYDROcodone-acetaminophen (NORCO) 7.5-325 MG per tablet  Depressive disorder, not elsewhere classified: Does not want to use antidepressants  Unspecified tinnitus: Chronic, unchanged. Patient seems to have learned to live with this annoying problem.    Kaydence Baba, Lenon Curt, MD

## 2013-04-03 ENCOUNTER — Other Ambulatory Visit: Payer: Self-pay | Admitting: Internal Medicine

## 2013-04-07 ENCOUNTER — Other Ambulatory Visit: Payer: Self-pay | Admitting: Internal Medicine

## 2013-07-03 ENCOUNTER — Other Ambulatory Visit: Payer: Self-pay | Admitting: Internal Medicine

## 2013-07-03 ENCOUNTER — Other Ambulatory Visit: Payer: Self-pay

## 2013-07-03 DIAGNOSIS — F419 Anxiety disorder, unspecified: Secondary | ICD-10-CM

## 2013-07-03 MED ORDER — ALPRAZOLAM ER 2 MG PO TB24: ORAL_TABLET | ORAL | Status: DC

## 2013-07-03 NOTE — Telephone Encounter (Signed)
RX was reprinted on RX paper vs plain white paper

## 2013-07-20 ENCOUNTER — Other Ambulatory Visit: Payer: Self-pay

## 2013-07-20 DIAGNOSIS — M25519 Pain in unspecified shoulder: Secondary | ICD-10-CM

## 2013-07-20 MED ORDER — HYDROCODONE-ACETAMINOPHEN 7.5-325 MG PO TABS: 1.0000 | ORAL_TABLET | Freq: Four times a day (QID) | ORAL | Status: DC | PRN

## 2013-07-25 ENCOUNTER — Other Ambulatory Visit: Payer: Self-pay | Admitting: Internal Medicine

## 2013-08-20 ENCOUNTER — Other Ambulatory Visit: Payer: Self-pay

## 2013-08-20 DIAGNOSIS — M25519 Pain in unspecified shoulder: Secondary | ICD-10-CM

## 2013-08-20 MED ORDER — HYDROCODONE-ACETAMINOPHEN 7.5-325 MG PO TABS: 1.0000 | ORAL_TABLET | Freq: Four times a day (QID) | ORAL | Status: DC | PRN

## 2013-09-01 ENCOUNTER — Ambulatory Visit (INDEPENDENT_AMBULATORY_CARE_PROVIDER_SITE_OTHER): Payer: Medicare Other | Admitting: Internal Medicine

## 2013-09-01 ENCOUNTER — Encounter: Payer: Self-pay | Admitting: Internal Medicine

## 2013-09-01 DIAGNOSIS — E669 Obesity, unspecified: Secondary | ICD-10-CM

## 2013-09-01 DIAGNOSIS — Z283 Underimmunization status: Secondary | ICD-10-CM | POA: Insufficient documentation

## 2013-09-01 DIAGNOSIS — K589 Irritable bowel syndrome without diarrhea: Secondary | ICD-10-CM

## 2013-09-01 DIAGNOSIS — Z23 Encounter for immunization: Secondary | ICD-10-CM

## 2013-09-01 DIAGNOSIS — F329 Major depressive disorder, single episode, unspecified: Secondary | ICD-10-CM

## 2013-09-01 DIAGNOSIS — H9319 Tinnitus, unspecified ear: Secondary | ICD-10-CM

## 2013-09-01 DIAGNOSIS — F411 Generalized anxiety disorder: Secondary | ICD-10-CM

## 2013-09-01 DIAGNOSIS — F3289 Other specified depressive episodes: Secondary | ICD-10-CM

## 2013-09-01 DIAGNOSIS — K219 Gastro-esophageal reflux disease without esophagitis: Secondary | ICD-10-CM

## 2013-09-01 DIAGNOSIS — F419 Anxiety disorder, unspecified: Secondary | ICD-10-CM

## 2013-09-01 MED ORDER — ZOSTER VACCINE LIVE 19400 UNT/0.65ML ~~LOC~~ SOLR: 0.6500 mL | Freq: Once | SUBCUTANEOUS | Status: DC

## 2013-09-01 NOTE — Patient Instructions (Signed)
Continue current medications. 

## 2013-09-01 NOTE — Progress Notes (Signed)
Patient ID: Logan Hayes, male   DOB: 1940-11-03, 72 y.o.   MRN: [id number]    Location:    [facility]  Place of Service:   OFFICE    Allergies  Allergen Reactions  . Mirtazapine Other (See Comments)    Restless, weak    Chief Complaint  Patient presents with  . Medical Managment of Chronic Issues    6 month f/u   . Immunizations    rx for Shingles vaccine to be printed.    HPI:  Unspecified tinnitus: unchanged  Anxiety: chronic. Has not used the higher dose of alprazolam  IBS (irritable bowel syndrome): unchanged  GERD (gastroesophageal reflux disease): some heartburn  Depressive disorder, not elsewhere classified: unchanged. No suicidal thoughts.  Obesity, unspecified: gaining weight. Low activity level.  Need for prophylactic vaccination and inoculation against other viral diseases(V04.89) - Plan: zoster vaccine live, PF, (ZOSTAVAX) 40981 UNT/0.65ML injection    Medications: Patient's Medications  New Prescriptions   No medications on file  Previous Medications   ALPRAZOLAM (XANAX) 1 MG TABLET    Take 1 mg by mouth at bedtime as needed for anxiety.   AMITRIPTYLINE (ELAVIL) 50 MG TABLET    TAKE 1 TABLET DURING THE DAY AND 2 TABLETS AT BEDTIME   ASPIRIN 81 MG TABLET    Take 81 mg by mouth daily. Take 1 tablet daily to prevent heart attack and stroke.   CHOLECALCIFEROL (VITAMIN D-3 PO)    Take by mouth.    CRESTOR 10 MG TABLET    TAKE 1 TABLET EVERY DAY TO LOWER CHOLESTEROL   FIBER FORMULA PO    Take 625 mg by mouth 2 (two) times daily. Take 1 tablet twice daily.   FINASTERIDE (PROSCAR) 5 MG TABLET    Take 5 mg by mouth Daily. Take 1 tablet once daily for urine dribbling/prostate enlargement.   HYDROCODONE-ACETAMINOPHEN (NORCO) 7.5-325 MG PER TABLET    Take 1 tablet by mouth every 6 (six) hours as needed.   OMEPRAZOLE (PRILOSEC OTC) 20 MG TABLET    Take 20 mg by mouth daily. Take 1 tablet once daily for GERD.   POLYETHYLENE GLYCOL (MIRALAX / GLYCOLAX) PACKET     Take 17 g by mouth daily.   PREDNICARBATE 0.1 % CREA    Apply topically. Apply sparingly to involved areas of face.   ZOSTER VACCINE LIVE, PF, (ZOSTAVAX) 19147 UNT/0.65ML INJECTION    Inject 0.65 mLs into the skin once.  Modified Medications   Modified Medication Previous Medication   ALPRAZOLAM (XANAX XR) 2 MG 24 HR TABLET ALPRAZolam (XANAX XR) 2 MG 24 hr tablet      1 mg. One tablet up to 3 times daily to help anxiety    One tablet up to 3 times daily to help anxiety  Discontinued Medications   HYOSCYAMINE (LEVSIN SL) 0.125 MG SL TABLET    Place 1 tablet (0.125 mg total) under the tongue every 4 (four) hours as needed for cramping (1-2 every 4 hours as needed for abdominal pain).   TIMOLOL (TIMOPTIC-XE) 0.5 % OPHTHALMIC GEL-FORMING    Place 1 drop into the right eye daily. Instill 1 drop into right eye once daily.     Review of Systems  Constitutional: Positive for activity change and fatigue. Negative for fever, chills and appetite change.  HENT: Negative for ear discharge and ear pain.        Severe tinnitus  Eyes: Negative.   Respiratory: Negative for apnea, cough, shortness of breath and wheezing.  Cardiovascular: Negative for chest pain, palpitations and leg swelling.  Gastrointestinal: Positive for constipation. Negative for nausea, abdominal pain, diarrhea, abdominal distention and rectal pain.  Endocrine: Negative.   Genitourinary:       Slow, weak, stream  Musculoskeletal: Positive for arthralgias (right ankle and shooulders).  Skin: Negative.   Allergic/Immunologic: Negative.   Neurological: Negative.   Hematological: Negative.   Psychiatric/Behavioral: Positive for sleep disturbance and decreased concentration. The patient is nervous/anxious.        Dyshporic mood with hx of depressioin    There were no vitals filed for this visit. Physical Exam  Constitutional: He is oriented to person, place, and time. He appears well-developed and well-nourished. No distress.    HENT:  Right Ear: External ear normal.  Left Ear: External ear normal.  Nose: Nose normal.  Mouth/Throat: Oropharynx is clear and moist. No oropharyngeal exudate.  Eyes: Conjunctivae and EOM are normal. Pupils are equal, round, and reactive to light.  Neck: No JVD present. No tracheal deviation present. No thyromegaly present.  Cardiovascular: Normal rate, regular rhythm, normal heart sounds and intact distal pulses.  Exam reveals no gallop and no friction rub.   No murmur heard. Pulmonary/Chest: No respiratory distress. He has no wheezes. He has no rales. He exhibits no tenderness.  Abdominal: He exhibits no distension and no mass. There is no tenderness.  Musculoskeletal: Normal range of motion. He exhibits tenderness (shoulders and right ankle). He exhibits no edema.  Lymphadenopathy:    He has no cervical adenopathy.  Neurological: He is alert and oriented to person, place, and time. He has normal reflexes. No cranial nerve deficit. Coordination normal.  12/06/10 MMSE 27/30. Passed clock drawing.  Skin: No rash noted. No erythema. No pallor.  Psychiatric: His behavior is normal. Thought content normal.     Labs reviewed: Office Visit on 09/01/2013  Component Date Value Range Status  . HM Colonoscopy 03/18/2012 polyps, repeat 5 years   Final      Assessment/Plan  1. Anxiety Use alprazolam  2. IBS (irritable bowel syndrome) Continue to monitor  3. Unspecified tinnitus unchanged  4. GERD (gastroesophageal reflux disease) unchanged  5. Depressive disorder, not elsewhere classified unchanged  6. Obesity, unspecified Discussed diet and weight loss  7. Need for prophylactic vaccination and inoculation against other viral diseases(V04.89) - zoster vaccine live, PF, (ZOSTAVAX) 16109 UNT/0.65ML injection; Inject 19,400 Units into the skin once.  Dispense: 1 each; Refill: 0

## 2013-09-15 ENCOUNTER — Ambulatory Visit: Payer: Medicare Other | Admitting: Internal Medicine

## 2013-10-02 DIAGNOSIS — R413 Other amnesia: Secondary | ICD-10-CM | POA: Insufficient documentation

## 2013-11-01 ENCOUNTER — Other Ambulatory Visit: Payer: Self-pay | Admitting: Internal Medicine

## 2013-11-03 ENCOUNTER — Other Ambulatory Visit: Payer: Self-pay | Admitting: *Deleted

## 2013-11-03 MED ORDER — HYDROCODONE-ACETAMINOPHEN 7.5-325 MG PO TABS: 1.0000 | ORAL_TABLET | Freq: Four times a day (QID) | ORAL | Status: DC | PRN

## 2013-12-07 ENCOUNTER — Other Ambulatory Visit: Payer: Self-pay | Admitting: *Deleted

## 2013-12-07 MED ORDER — HYDROCODONE-ACETAMINOPHEN 7.5-325 MG PO TABS: 1.0000 | ORAL_TABLET | Freq: Four times a day (QID) | ORAL | Status: DC | PRN

## 2013-12-22 ENCOUNTER — Ambulatory Visit (INDEPENDENT_AMBULATORY_CARE_PROVIDER_SITE_OTHER): Payer: Medicare Other | Admitting: Internal Medicine

## 2013-12-22 ENCOUNTER — Encounter: Payer: Self-pay | Admitting: Internal Medicine

## 2013-12-22 VITALS — BP 142/76 | HR 92 | Temp 98.8°F | Resp 16 | Wt 193.2 lb

## 2013-12-22 DIAGNOSIS — R21 Rash and other nonspecific skin eruption: Secondary | ICD-10-CM

## 2013-12-22 MED ORDER — CEPHALEXIN 500 MG PO CAPS: ORAL_CAPSULE | ORAL | Status: DC

## 2013-12-22 NOTE — Patient Instructions (Signed)
Use antibiotic as directed.

## 2013-12-22 NOTE — Progress Notes (Signed)
Patient ID: Logan Hayes, male   DOB: 12-May-1941, 74 y.o.   MRN: [id number]    Location:  [facility]   Place of Service: OFFICE    Allergies  Allergen Reactions  . Mirtazapine Other (See Comments)    Restless, weak    Chief Complaint  Patient presents with  . Acute Visit    scrotal nodule x 4-5 days    HPI:  About one week ago, this patient noted the onset of nodular areas. The first was on the lower abdomen just above the pubis on the left side anteriorly. We then found additional nodules in the perirectal area and scrotum bilaterally. They are mildly painful and slightly itchy. He denies any fever. There have been no sexual encounters. He denies changing laundry soaps. There is no lymphadenopathy. He has never had lesions such as this previously. He steadfastly denies any contact with ticks or spiders.  Medications: Patient's Medications  New Prescriptions   CEPHALEXIN (KEFLEX) 500 MG CAPSULE    Take one 4 times daily for infection  Previous Medications   ALPRAZOLAM (XANAX) 1 MG TABLET    TAKE 1 TABLET BY MOUTH UP TO 3 TIMES A DAY AS NEEDED FOR NERVES   AMITRIPTYLINE (ELAVIL) 50 MG TABLET    TAKE 1 TABLET DURING THE DAY AND 2 TABLETS AT BEDTIME   ASPIRIN 81 MG TABLET    Take 81 mg by mouth daily. Take 1 tablet daily to prevent heart attack and stroke.   CHOLECALCIFEROL (VITAMIN D-3 PO)    Take by mouth.    CRESTOR 10 MG TABLET    TAKE 1 TABLET EVERY DAY TO LOWER CHOLESTEROL   FIBER FORMULA PO    Take 625 mg by mouth 2 (two) times daily. Take 1 tablet twice daily.   FINASTERIDE (PROSCAR) 5 MG TABLET    Take 5 mg by mouth Daily. Take 1 tablet once daily for urine dribbling/prostate enlargement.   HYDROCODONE-ACETAMINOPHEN (NORCO) 7.5-325 MG PER TABLET    Take 1 tablet by mouth every 6 (six) hours as needed.   OMEPRAZOLE (PRILOSEC OTC) 20 MG TABLET    Take 20 mg by mouth daily. Take 1 tablet once daily for GERD.   POLYETHYLENE GLYCOL (MIRALAX / GLYCOLAX) PACKET    Take 17 g by  mouth daily.   PREDNICARBATE 0.1 % CREA    Apply topically. Apply sparingly to involved areas of face.  Modified Medications   No medications on file  Discontinued Medications   ZOSTER VACCINE LIVE, PF, (ZOSTAVAX) 84132 UNT/0.65ML INJECTION    Inject 19,400 Units into the skin once.     Review of Systems  Constitutional: Positive for activity change and fatigue. Negative for fever, chills and appetite change.  HENT: Negative for ear discharge and ear pain.        Severe tinnitus  Eyes: Negative.   Respiratory: Negative for apnea, cough, shortness of breath and wheezing.   Cardiovascular: Negative for chest pain, palpitations and leg swelling.  Gastrointestinal: Positive for constipation. Negative for nausea, abdominal pain, diarrhea, abdominal distention and rectal pain.  Endocrine: Negative.   Genitourinary:       Slow, weak, stream  Musculoskeletal: Positive for arthralgias (right ankle and shoulders).  Skin:       Several nodular lesions with erythema, mild itching, and mild discomfort noted since about/12/15/13.  Allergic/Immunologic: Negative.   Neurological: Negative.   Hematological: Negative.   Psychiatric/Behavioral: Positive for sleep disturbance and decreased concentration. The patient is nervous/anxious.  Dyshporic mood with hx of depressioin    Filed Vitals:   12/22/13 1616  BP: 142/76  Pulse: 92  Temp: 98.8 F (37.1 C)  TempSrc: Oral  Resp: 16  Weight: 193 lb 3.2 oz (87.635 kg)  SpO2: 96%   Physical Exam  Constitutional: He is oriented to person, place, and time. He appears well-developed and well-nourished. No distress.  HENT:  Right Ear: External ear normal.  Left Ear: External ear normal.  Nose: Nose normal.  Mouth/Throat: Oropharynx is clear and moist. No oropharyngeal exudate.  Eyes: Conjunctivae and EOM are normal. Pupils are equal, round, and reactive to light.  Neck: No JVD present. No tracheal deviation present. No thyromegaly present.    Cardiovascular: Normal rate, regular rhythm, normal heart sounds and intact distal pulses.  Exam reveals no gallop and no friction rub.   No murmur heard. Pulmonary/Chest: No respiratory distress. He has no wheezes. He has no rales. He exhibits no tenderness.  Abdominal: He exhibits no distension and no mass. There is no tenderness.  No inguinal adenopathy  Musculoskeletal: Normal range of motion. He exhibits tenderness (shoulders and right ankle). He exhibits no edema.  Lymphadenopathy:    He has no cervical adenopathy.  Neurological: He is alert and oriented to person, place, and time. He has normal reflexes. No cranial nerve deficit. Coordination normal.  12/06/10 MMSE 27/30. Passed clock drawing.  Skin: No rash noted. No erythema. No pallor.  Multiple lesions numbering about 5. The oldest appears to be in the lower abdomen above the pubis on the left side. This has a black spot the patient admits that he tried to cut the lesion of. There was no purulence expressed at this point. He says he did not go deeply into the lesion. Additional lesions are located in the scrotum bilaterally and at about 4:00 in the perirectal area. Each lesion is about 1 cm in diameter, erythematous, and tender with pressure. There is no evidence of blistering or oozing.  Psychiatric: His behavior is normal. Thought content normal.     Labs reviewed: No visits with results within 3 Month(s) from this visit. Latest known visit with results is:  Office Visit on 09/01/2013  Component Date Value Ref Range Status  . HM Colonoscopy 03/18/2012 polyps, repeat 5 years   Final      Assessment/Plan  1. Rash and nonspecific skin eruption Etiology of the lesions is obscured. There mostly to me like a spider bite or tick bite there are no tick parts present in the center of the lesion. Patient finds it hard to believe that he could come in contact spider. - cephALEXin (KEFLEX) 500 MG capsule; Take one 4 times daily for  infection  Dispense: 30 capsule; Refill: 1  He will use the antibiotic noted above and return for recheck in one week. He was advised to call us sooner if multiple bites change, or if there is no improvement, or if he raises a fever.

## 2013-12-29 ENCOUNTER — Encounter: Payer: Self-pay | Admitting: Internal Medicine

## 2013-12-29 ENCOUNTER — Ambulatory Visit (INDEPENDENT_AMBULATORY_CARE_PROVIDER_SITE_OTHER): Payer: Medicare Other | Admitting: Internal Medicine

## 2013-12-29 VITALS — BP 130/84 | HR 90 | Temp 98.5°F | Wt 193.0 lb

## 2013-12-29 DIAGNOSIS — R21 Rash and other nonspecific skin eruption: Secondary | ICD-10-CM

## 2013-12-29 NOTE — Patient Instructions (Addendum)
Continue current medications. 

## 2013-12-29 NOTE — Progress Notes (Signed)
Patient ID: Logan Hayes, male   DOB: 07/22/1941, 73 y.o.   MRN: [id number]    Location:  [facility]   Place of Service: OFFICE    Allergies  Allergen Reactions  . Mirtazapine Other (See Comments)    Restless, weak    Chief Complaint  Patient presents with  . Follow-up    1 week f/u nodules    HPI:  Lesions began to improve once he started the antibiotic. Less discomfort.  Medications: Patient's Medications  New Prescriptions   No medications on file  Previous Medications   ALPRAZOLAM (XANAX) 1 MG TABLET    TAKE 1 TABLET BY MOUTH UP TO 3 TIMES A DAY AS NEEDED FOR NERVES   AMITRIPTYLINE (ELAVIL) 50 MG TABLET    TAKE 1 TABLET DURING THE DAY AND 2 TABLETS AT BEDTIME   ASPIRIN 81 MG TABLET    Take 81 mg by mouth daily. Take 1 tablet daily to prevent heart attack and stroke.   CEPHALEXIN (KEFLEX) 500 MG CAPSULE    Take one 4 times daily for infection   CHOLECALCIFEROL (VITAMIN D-3 PO)    Take by mouth.    CRESTOR 10 MG TABLET    TAKE 1 TABLET EVERY DAY TO LOWER CHOLESTEROL   FIBER FORMULA PO    Take 625 mg by mouth 2 (two) times daily. Take 1 tablet twice daily.   FINASTERIDE (PROSCAR) 5 MG TABLET    Take 5 mg by mouth Daily. Take 1 tablet once daily for urine dribbling/prostate enlargement.   HYDROCODONE-ACETAMINOPHEN (NORCO) 7.5-325 MG PER TABLET    Take 1 tablet by mouth every 6 (six) hours as needed.   OMEPRAZOLE (PRILOSEC OTC) 20 MG TABLET    Take 20 mg by mouth daily. Take 1 tablet once daily for GERD.   POLYETHYLENE GLYCOL (MIRALAX / GLYCOLAX) PACKET    Take 17 g by mouth daily.  Modified Medications   No medications on file  Discontinued Medications   PREDNICARBATE 0.1 % CREA    Apply topically. Apply sparingly to involved areas of face.     Review of Systems  Constitutional: Positive for activity change and fatigue. Negative for fever, chills and appetite change.  HENT: Negative for ear discharge and ear pain.        Severe tinnitus  Eyes: Negative.     Respiratory: Negative for apnea, cough, shortness of breath and wheezing.   Cardiovascular: Negative for chest pain, palpitations and leg swelling.  Gastrointestinal: Positive for constipation. Negative for nausea, abdominal pain, diarrhea, abdominal distention and rectal pain.  Endocrine: Negative.   Genitourinary:       Slow, weak, stream  Musculoskeletal: Positive for arthralgias (right ankle and shoulders).  Skin:       Several nodular lesions with erythema, mild itching, and mild discomfort noted since about/12/15/13.  Allergic/Immunologic: Negative.   Neurological: Negative.   Hematological: Negative.   Psychiatric/Behavioral: Positive for sleep disturbance and decreased concentration. The patient is nervous/anxious.        Dyshporic mood with hx of depressioin    Filed Vitals:   12/29/13 1358  BP: 130/84  Pulse: 90  Temp: 98.5 F (36.9 C)  TempSrc: Oral  Weight: 193 lb (87.544 kg)  SpO2: 94%   Physical Exam  Constitutional: He is oriented to person, place, and time. He appears well-developed and well-nourished. No distress.  HENT:  Right Ear: External ear normal.  Left Ear: External ear normal.  Nose: Nose normal.  Mouth/Throat: Oropharynx is clear and  moist. No oropharyngeal exudate.  Eyes: Conjunctivae and EOM are normal. Pupils are equal, round, and reactive to light.  Neck: No JVD present. No tracheal deviation present. No thyromegaly present.  Cardiovascular: Normal rate, regular rhythm, normal heart sounds and intact distal pulses.  Exam reveals no gallop and no friction rub.   No murmur heard. Pulmonary/Chest: No respiratory distress. He has no wheezes. He has no rales. He exhibits no tenderness.  Abdominal: He exhibits no distension and no mass. There is no tenderness.  No inguinal adenopathy  Musculoskeletal: Normal range of motion. He exhibits tenderness (shoulders and right ankle). He exhibits no edema.  Lymphadenopathy:    He has no cervical adenopathy.   Neurological: He is alert and oriented to person, place, and time. He has normal reflexes. No cranial nerve deficit. Coordination normal.  12/06/10 MMSE 27/30. Passed clock drawing.  Skin: No rash noted. No erythema. No pallor.  Multiple lesions numbering about 5. All are improving. Lesions are smaller and softer.  Psychiatric: His behavior is normal. Thought content normal.     Labs reviewed: No visits with results within 3 Month(s) from this visit. Latest known visit with results is:  Office Visit on 09/01/2013  Component Date Value Ref Range Status  . HM Colonoscopy 03/18/2012 polyps, repeat 5 years   Final      Assessment/Plan  1. Rash and nonspecific skin eruption: finish Keflex. Return as needed.

## 2014-01-12 ENCOUNTER — Other Ambulatory Visit: Payer: Self-pay | Admitting: Internal Medicine

## 2014-01-19 ENCOUNTER — Other Ambulatory Visit: Payer: Self-pay | Admitting: Internal Medicine

## 2014-01-20 ENCOUNTER — Other Ambulatory Visit: Payer: Self-pay | Admitting: *Deleted

## 2014-01-20 MED ORDER — HYDROCODONE-ACETAMINOPHEN 7.5-325 MG PO TABS: 1.0000 | ORAL_TABLET | Freq: Four times a day (QID) | ORAL | Status: DC | PRN

## 2014-01-20 NOTE — Telephone Encounter (Signed)
Patient requesting refill on antibiotic. Please advise!

## 2014-01-20 NOTE — Telephone Encounter (Signed)
It is Ok to fill this prescription.

## 2014-02-18 ENCOUNTER — Other Ambulatory Visit: Payer: Self-pay | Admitting: *Deleted

## 2014-02-18 MED ORDER — HYDROCODONE-ACETAMINOPHEN 7.5-325 MG PO TABS: 1.0000 | ORAL_TABLET | Freq: Four times a day (QID) | ORAL | Status: DC | PRN

## 2014-02-22 ENCOUNTER — Other Ambulatory Visit: Payer: Self-pay | Admitting: *Deleted

## 2014-02-22 MED ORDER — CEPHALEXIN 500 MG PO CAPS: ORAL_CAPSULE | ORAL | Status: DC

## 2014-02-24 ENCOUNTER — Telehealth: Payer: Self-pay | Admitting: *Deleted

## 2014-02-24 NOTE — Telephone Encounter (Signed)
Patient called and stated that an antibiotic was called into the pharmacy and patient was unsure why it was called. I spoke with Ivin Booty and she stated that patient had requested it back in May. Patient wasn't sure, so he stated that he would just hold off taking it until his next Dental procedure.

## 2014-03-02 ENCOUNTER — Ambulatory Visit (INDEPENDENT_AMBULATORY_CARE_PROVIDER_SITE_OTHER): Payer: Medicare Other | Admitting: Internal Medicine

## 2014-03-02 ENCOUNTER — Encounter: Payer: Self-pay | Admitting: Internal Medicine

## 2014-03-02 VITALS — BP 130/90 | HR 84 | Temp 97.4°F | Resp 20 | Ht 68.0 in | Wt 195.4 lb

## 2014-03-02 DIAGNOSIS — R21 Rash and other nonspecific skin eruption: Secondary | ICD-10-CM

## 2014-03-02 DIAGNOSIS — F329 Major depressive disorder, single episode, unspecified: Secondary | ICD-10-CM

## 2014-03-02 DIAGNOSIS — M25519 Pain in unspecified shoulder: Secondary | ICD-10-CM

## 2014-03-02 DIAGNOSIS — L821 Other seborrheic keratosis: Secondary | ICD-10-CM

## 2014-03-02 DIAGNOSIS — R413 Other amnesia: Secondary | ICD-10-CM

## 2014-03-02 DIAGNOSIS — H9319 Tinnitus, unspecified ear: Secondary | ICD-10-CM

## 2014-03-02 DIAGNOSIS — F3289 Other specified depressive episodes: Secondary | ICD-10-CM

## 2014-03-02 DIAGNOSIS — E785 Hyperlipidemia, unspecified: Secondary | ICD-10-CM

## 2014-03-02 MED ORDER — CITALOPRAM HYDROBROMIDE 20 MG PO TABS: ORAL_TABLET | ORAL | Status: DC

## 2014-03-02 NOTE — Progress Notes (Signed)
Patient ID: Logan Hayes, male   DOB: [birth year], 73 y.o.   MRN: [id number]    Location:    [facility]  Place of Service:  OFFICE    Allergies  Allergen Reactions  . Mirtazapine Other (See Comments)    Restless, weak    Chief Complaint  Patient presents with  . Follow-up    HPI:  Memory changes - increasing loss  Depressive disorder, not elsewhere classified: agrees he is depressed, but not suicidal  [name], shoulder region, unspecified laterality: unchanged  Rash and nonspecific skin eruption: resolved  Unspecified tinnitus: unchanged  Hyperlipidemia - due for Lipid panel    Medications: Patient's Medications  New Prescriptions   No medications on file  Previous Medications   ALPRAZOLAM (XANAX) 1 MG TABLET    TAKE 1 TABLET BY MOUTH UP TO 3 TIMES A DAY AS NEEDED FOR NERVES   AMITRIPTYLINE (ELAVIL) 50 MG TABLET    TAKE 1 TABLET DURING THE DAY AND 2 TABLETS AT BEDTIME   ASPIRIN 81 MG TABLET    Take 81 mg by mouth daily. Take 1 tablet daily to prevent heart attack and stroke.   CEPHALEXIN (KEFLEX) 500 MG CAPSULE    TAKE ONE CAPSULE BY MOUTH 4 TIMES A DAY FOR INFECTION   CHOLECALCIFEROL (VITAMIN D-3 PO)    Take by mouth.    CRESTOR 10 MG TABLET    TAKE 1 TABLET EVERY DAY TO LOWER CHOLESTEROL   FIBER FORMULA PO    Take 625 mg by mouth 2 (two) times daily. Take 1 tablet twice daily.   FINASTERIDE (PROSCAR) 5 MG TABLET    Take 5 mg by mouth Daily. Take 1 tablet once daily for urine dribbling/prostate enlargement.   HYDROCODONE-ACETAMINOPHEN (NORCO) 7.5-325 MG PER TABLET    Take 1 tablet by mouth every 6 (six) hours as needed.   OMEPRAZOLE (PRILOSEC OTC) 20 MG TABLET    Take 20 mg by mouth daily. Take 1 tablet once daily for GERD.   POLYETHYLENE GLYCOL (MIRALAX / GLYCOLAX) PACKET    Take 17 g by mouth daily.  Modified Medications   No medications on file  Discontinued Medications   No medications on file     Review of Systems  Constitutional: Positive for  activity change and fatigue. Negative for fever, chills and appetite change.  HENT: Negative for ear discharge and ear pain.        Severe tinnitus  Eyes: Negative.   Respiratory: Negative for apnea, cough, shortness of breath and wheezing.   Cardiovascular: Negative for chest pain, palpitations and leg swelling.  Gastrointestinal: Positive for constipation. Negative for nausea, abdominal pain, diarrhea, abdominal distention and rectal pain.  Endocrine: Negative.   Genitourinary:       Slow, weak, stream  Musculoskeletal: Positive for arthralgias (right ankle and shoulders).  Allergic/Immunologic: Negative.   Neurological: Negative.   Hematological: Negative.   Psychiatric/Behavioral: Positive for sleep disturbance and decreased concentration. The patient is nervous/anxious.        Dyshporic mood with hx of depressioin    Filed Vitals:   03/02/14 1336  BP: 130/90  Pulse: 84  Temp: 97.4 F (36.3 C)  TempSrc: Oral  Resp: 20  Height: 5\' 8"  (1.727 m)  Weight: 195 lb 6.4 oz (88.633 kg)  SpO2: 95%   Body mass index is 29.72 kg/(m^2).  Physical Exam  Constitutional: He is oriented to person, place, and time. He appears well-developed and well-nourished. No distress.  HENT:  Right Ear:  External ear normal.  Left Ear: External ear normal.  Nose: Nose normal.  Mouth/Throat: Oropharynx is clear and moist. No oropharyngeal exudate.  Eyes: Conjunctivae and EOM are normal. Pupils are equal, round, and reactive to light.  Neck: No JVD present. No tracheal deviation present. No thyromegaly present.  Cardiovascular: Normal rate, regular rhythm, normal heart sounds and intact distal pulses.  Exam reveals no gallop and no friction rub.   No murmur heard. Pulmonary/Chest: No respiratory distress. He has no wheezes. He has no rales. He exhibits no tenderness.  Abdominal: He exhibits no distension and no mass. There is no tenderness.  No inguinal adenopathy  Musculoskeletal: Normal range of  motion. He exhibits tenderness (shoulders and right ankle). He exhibits no edema.  Lymphadenopathy:    He has no cervical adenopathy.  Neurological: He is alert and oriented to person, place, and time. He has normal reflexes. No cranial nerve deficit. Coordination normal.  12/06/10 MMSE 27/30. Passed clock drawing.  Skin: No rash noted. No erythema. No pallor.  Psychiatric: His behavior is normal. Thought content normal.     Labs reviewed: No visits with results within 3 Month(s) from this visit. Latest known visit with results is:  Office Visit on 09/01/2013  Component Date Value Ref Range Status  . HM Colonoscopy 03/18/2012 polyps, repeat 5 years   Final      Assessment/Plan  1. Memory changes MMSE next visit - Comprehensive metabolic panel; Future  2. Depressive disorder, not elsewhere classified Start citalopram 20 mg qd  3. [name], shoulder region, unspecified laterality unchanged  4. Rash and nonspecific skin eruption resolved  5. Unspecified tinnitus unchanged  6. Hyperlipidemia - Lipid panel; Future  7. Seborrheic keratoses benign

## 2014-03-02 NOTE — Patient Instructions (Signed)
Take medications as listed

## 2014-03-30 ENCOUNTER — Other Ambulatory Visit: Payer: Self-pay | Admitting: *Deleted

## 2014-03-30 MED ORDER — HYDROCODONE-ACETAMINOPHEN 7.5-325 MG PO TABS: 1.0000 | ORAL_TABLET | Freq: Four times a day (QID) | ORAL | Status: DC | PRN

## 2014-03-30 NOTE — Telephone Encounter (Signed)
Patient Requested 

## 2014-03-31 ENCOUNTER — Other Ambulatory Visit: Payer: Self-pay | Admitting: Internal Medicine

## 2014-04-07 ENCOUNTER — Telehealth: Payer: Self-pay

## 2014-04-07 NOTE — Telephone Encounter (Signed)
I called [phone] to initiate PA, I was told by the customer service representative the medication does not require a PA. Medication is covered, the Co-pay is 43.39 vs 19.19 co-pay in March 2015. Patient will need alternative if he is unwilling to pay co-pay.  I contacted the patient, patient states he is unable to afford medication at a co-pay of 43.39, patient would like alternative  Per Dr.Green (standing near me at the time of call), there is no cheaper alternative. Patient verbalized understanding of this and states he started this medication when he was going through something and that situation is now better. Patient not sure if he needs this medication at this time. Patient states he will further address this with Dr.Green @ pending appointment 04/28/2014.

## 2014-04-26 ENCOUNTER — Other Ambulatory Visit: Payer: Medicare Other

## 2014-04-26 DIAGNOSIS — R413 Other amnesia: Secondary | ICD-10-CM

## 2014-04-26 DIAGNOSIS — E785 Hyperlipidemia, unspecified: Secondary | ICD-10-CM

## 2014-04-27 LAB — COMPREHENSIVE METABOLIC PANEL
ALBUMIN: 4.3 g/dL (ref 3.5–4.8)
ALT: 25 IU/L (ref 0–44)
AST: 15 IU/L (ref 0–40)
Albumin/Globulin Ratio: 1.8 (ref 1.1–2.5)
Alkaline Phosphatase: 58 IU/L (ref 39–117)
BILIRUBIN TOTAL: 0.3 mg/dL (ref 0.0–1.2)
BUN/Creatinine Ratio: 9 — ABNORMAL LOW (ref 10–22)
BUN: 14 mg/dL (ref 8–27)
CHLORIDE: 100 mmol/L (ref 97–108)
CO2: 21 mmol/L (ref 18–29)
CREATININE: 1.52 mg/dL — AB (ref 0.76–1.27)
Calcium: 9.4 mg/dL (ref 8.6–10.2)
GFR calc non Af Amer: 45 mL/min/{1.73_m2} — ABNORMAL LOW (ref >59)
GFR, EST AFRICAN AMERICAN: 53 mL/min/{1.73_m2} — AB (ref >59)
GLOBULIN, TOTAL: 2.4 g/dL (ref 1.5–4.5)
GLUCOSE: 92 mg/dL (ref 65–99)
Potassium: 4.4 mmol/L (ref 3.5–5.2)
Sodium: 140 mmol/L (ref 134–144)
Total Protein: 6.7 g/dL (ref 6.0–8.5)

## 2014-04-27 LAB — LIPID PANEL
Chol/HDL Ratio: 4 ratio units (ref 0.0–5.0)
Cholesterol, Total: 194 mg/dL (ref 100–199)
HDL: 49 mg/dL (ref >39)
LDL Calculated: 89 mg/dL (ref 0–99)
Triglycerides: 281 mg/dL — ABNORMAL HIGH (ref 0–149)
VLDL CHOLESTEROL CAL: 56 mg/dL — AB (ref 5–40)

## 2014-04-28 ENCOUNTER — Ambulatory Visit (INDEPENDENT_AMBULATORY_CARE_PROVIDER_SITE_OTHER): Payer: Medicare Other | Admitting: Internal Medicine

## 2014-04-28 ENCOUNTER — Encounter: Payer: Self-pay | Admitting: Internal Medicine

## 2014-04-28 VITALS — BP 118/74 | HR 80 | Temp 98.6°F | Wt 192.2 lb

## 2014-04-28 DIAGNOSIS — E785 Hyperlipidemia, unspecified: Secondary | ICD-10-CM

## 2014-04-28 DIAGNOSIS — F3289 Other specified depressive episodes: Secondary | ICD-10-CM

## 2014-04-28 DIAGNOSIS — F329 Major depressive disorder, single episode, unspecified: Secondary | ICD-10-CM

## 2014-04-28 DIAGNOSIS — M25519 Pain in unspecified shoulder: Secondary | ICD-10-CM

## 2014-04-28 DIAGNOSIS — K5900 Constipation, unspecified: Secondary | ICD-10-CM

## 2014-04-28 DIAGNOSIS — H9319 Tinnitus, unspecified ear: Secondary | ICD-10-CM

## 2014-04-28 MED ORDER — CITALOPRAM HYDROBROMIDE 40 MG PO TABS: ORAL_TABLET | ORAL | Status: DC

## 2014-04-28 MED ORDER — SENNOSIDES 8.6 MG PO TABS: ORAL_TABLET | ORAL | Status: DC

## 2014-04-28 NOTE — Progress Notes (Signed)
Patient ID: Logan Hayes, male   DOB: 1941-01-21, 73 y.o.   MRN: [id number]    Location:    [facility]  Place of Service:  Office    Allergies  Allergen Reactions  . Mirtazapine Other (See Comments)    Restless, weak    Chief Complaint  Patient presents with  . Follow-up    8 week f/u & discuss labs (printed)  . other    [name] has gotten worse, neg Fall Screening    HPI:  He stopped amitriptyline about 6 months ago when price increased from $17 to $40 per month. Also, he was gaing weight. He did not feel it was helping anything.  "colon problems" Stools constipated and small."[name] had 2 good bowel movements in 2 months". He thinks inactivity contributes. Using hydrocodone.  Constipation:worsening  Unspecified tinnitus: worsening  [name], shoulder region, unspecified laterality: unchanged  Hyperlipidemia:controlled  Depressive disorder, not elsewhere classified - no better     Medications: Patient's Medications  New Prescriptions   No medications on file  Previous Medications   ALPRAZOLAM (XANAX) 1 MG TABLET    TAKE 1 TABLET BY MOUTH UP TO 3 TIMES DAILY AS NEEDED FOR NERVES   ALPRAZOLAM (XANAX) 1 MG TABLET    Take 1 mg by mouth.   AMITRIPTYLINE (ELAVIL) 150 MG TABLET    Take 150 mg by mouth.   AMITRIPTYLINE (ELAVIL) 50 MG TABLET    TAKE 1 TABLET DURING THE DAY AND 2 TABLETS AT BEDTIME   ASPIRIN 81 MG TABLET    Take 81 mg by mouth daily. Take 1 tablet daily to prevent heart attack and stroke.   CHOLECALCIFEROL (VITAMIN D-3 PO)    Take by mouth.    CITALOPRAM (CELEXA) 20 MG TABLET    One daily to help depression and nerves   CRESTOR 10 MG TABLET    TAKE 1 TABLET EVERY DAY TO LOWER CHOLESTEROL   FIBER FORMULA PO    Take 625 mg by mouth 2 (two) times daily. Take 1 tablet twice daily.   FINASTERIDE (PROSCAR) 5 MG TABLET    Take 5 mg by mouth Daily. Take 1 tablet once daily for urine dribbling/prostate enlargement.   HYDROCODONE-ACETAMINOPHEN (NORCO) 7.5-325  MG PER TABLET    Take 1 tablet by mouth every 6 (six) hours as needed.   OMEPRAZOLE (PRILOSEC OTC) 20 MG TABLET    Take 20 mg by mouth daily. Take 1 tablet once daily for GERD.   POLYETHYLENE GLYCOL (MIRALAX / GLYCOLAX) PACKET    Take 17 g by mouth daily.  Modified Medications   No medications on file  Discontinued Medications   CEPHALEXIN (KEFLEX) 500 MG CAPSULE    TAKE ONE CAPSULE BY MOUTH 4 TIMES A DAY FOR INFECTION     Review of Systems  Constitutional: Positive for activity change and fatigue. Negative for fever, chills and appetite change.  HENT: Negative for ear discharge and ear pain.        Severe tinnitus  Eyes: Negative.   Respiratory: Negative for apnea, cough, shortness of breath and wheezing.   Cardiovascular: Negative for chest pain, palpitations and leg swelling.  Gastrointestinal: Positive for constipation. Negative for nausea, abdominal pain, diarrhea, abdominal distention and rectal pain.  Endocrine: Negative.   Genitourinary:       Slow, weak, stream  Musculoskeletal: Positive for arthralgias (right ankle and shoulders).  Allergic/Immunologic: Negative.   Neurological: Negative.   Hematological: Negative.   Psychiatric/Behavioral: Positive for sleep disturbance and decreased  concentration. The patient is nervous/anxious.        Dyshporic mood with hx of depressioin    Filed Vitals:   04/28/14 1503  BP: 118/74  Pulse: 80  Temp: 98.6 F (37 C)  TempSrc: Oral  Weight: 192 lb 3.2 oz (87.181 kg)  SpO2: 96%   Body mass index is 29.23 kg/(m^2).  Physical Exam  Constitutional: He is oriented to person, place, and time. He appears well-developed and well-nourished. No distress.  HENT:  Right Ear: External ear normal.  Left Ear: External ear normal.  Nose: Nose normal.  Mouth/Throat: Oropharynx is clear and moist. No oropharyngeal exudate.  Eyes: Conjunctivae and EOM are normal. Pupils are equal, round, and reactive to light.  Neck: No JVD present. No  tracheal deviation present. No thyromegaly present.  Cardiovascular: Normal rate, regular rhythm, normal heart sounds and intact distal pulses.  Exam reveals no gallop and no friction rub.   No murmur heard. Pulmonary/Chest: No respiratory distress. He has no wheezes. He has no rales. He exhibits no tenderness.  Abdominal: He exhibits no distension and no mass. There is no tenderness.  No inguinal adenopathy  Musculoskeletal: Normal range of motion. He exhibits tenderness (shoulders and right ankle). He exhibits no edema.  Lymphadenopathy:    He has no cervical adenopathy.  Neurological: He is alert and oriented to person, place, and time. He has normal reflexes. No cranial nerve deficit. Coordination normal.  12/06/10 MMSE 27/30. Passed clock drawing.  Skin: No rash noted. No erythema. No pallor.  Psychiatric: His behavior is normal. Thought content normal.     Labs reviewed: Appointment on 04/26/2014  Component Date Value Ref Range Status  . Cholesterol, Total 04/26/2014 194  100 - 199 mg/dL Final  . Triglycerides 04/26/2014 281* 0 - 149 mg/dL Final  . HDL 04/26/2014 49  >39 mg/dL Final   Comment: According to ATP-III Guidelines, HDL-C >59 mg/dL is considered a                          negative risk factor for CHD.  Marland Kitchen VLDL Cholesterol Cal 04/26/2014 56* 5 - 40 mg/dL Final  . LDL Calculated 04/26/2014 89  0 - 99 mg/dL Final  . Chol/HDL Ratio 04/26/2014 4.0  0.0 - 5.0 ratio units Final   Comment:                                   T. Chol/HDL Ratio                                                                      Men  Women                                                        1/2 Avg.Risk  3.4    3.3  Avg.Risk  5.0    4.4                                                         2X Avg.Risk  9.6    7.1                                                         3X Avg.Risk 23.4   11.0  . Glucose 04/26/2014 92  65 - 99 mg/dL Final    . BUN 04/26/2014 14  8 - 27 mg/dL Final  . Creatinine, Ser 04/26/2014 1.52* 0.76 - 1.27 mg/dL Final  . GFR calc non Af Amer 04/26/2014 45* >59 mL/min/1.73 Final  . GFR calc Af Amer 04/26/2014 53* >59 mL/min/1.73 Final  . BUN/Creatinine Ratio 04/26/2014 9* 10 - 22 Final  . Sodium 04/26/2014 140  134 - 144 mmol/L Final  . Potassium 04/26/2014 4.4  3.5 - 5.2 mmol/L Final  . Chloride 04/26/2014 100  97 - 108 mmol/L Final  . CO2 04/26/2014 21  18 - 29 mmol/L Final  . Calcium 04/26/2014 9.4  8.6 - 10.2 mg/dL Final  . Total Protein 04/26/2014 6.7  6.0 - 8.5 g/dL Final  . Albumin 04/26/2014 4.3  3.5 - 4.8 g/dL Final  . Globulin, Total 04/26/2014 2.4  1.5 - 4.5 g/dL Final  . Albumin/Globulin Ratio 04/26/2014 1.8  1.1 - 2.5 Final  . Total Bilirubin 04/26/2014 0.3  0.0 - 1.2 mg/dL Final  . Alkaline Phosphatase 04/26/2014 58  39 - 117 IU/L Final  . AST 04/26/2014 15  0 - 40 IU/L Final  . ALT 04/26/2014 25  0 - 44 IU/L Final   Assessment/Plan  1. Constipation - senna (SENOKOT) 8.6 MG tablet; 2 tablets each evening to prevent constipation  Dispense: 100 tablet; Refill: 4  2. Unspecified tinnitus chronic  3. [name], shoulder region, unspecified laterality chronic  4. Hyperlipidemia controlled  5. Depressive disorder, not elsewhere classified Increase citalopram - citalopram (CELEXA) 40 MG tablet; One daily for depression  Dispense: 30 tablet; Refill: 3

## 2014-04-30 ENCOUNTER — Other Ambulatory Visit: Payer: Self-pay | Admitting: Internal Medicine

## 2014-05-03 ENCOUNTER — Other Ambulatory Visit: Payer: Self-pay | Admitting: Internal Medicine

## 2014-05-03 ENCOUNTER — Other Ambulatory Visit: Payer: Self-pay | Admitting: *Deleted

## 2014-05-03 MED ORDER — HYDROCODONE-ACETAMINOPHEN 7.5-325 MG PO TABS: 1.0000 | ORAL_TABLET | Freq: Four times a day (QID) | ORAL | Status: DC | PRN

## 2014-05-03 NOTE — Telephone Encounter (Signed)
Patient Requested 

## 2014-05-26 ENCOUNTER — Other Ambulatory Visit: Payer: Self-pay | Admitting: Internal Medicine

## 2014-05-27 ENCOUNTER — Other Ambulatory Visit: Payer: Self-pay | Admitting: Internal Medicine

## 2014-05-28 NOTE — Telephone Encounter (Signed)
Called pharmacy and informed them rx request goes to dentist

## 2014-05-29 ENCOUNTER — Other Ambulatory Visit: Payer: Self-pay | Admitting: Internal Medicine

## 2014-06-02 ENCOUNTER — Other Ambulatory Visit: Payer: Self-pay | Admitting: *Deleted

## 2014-06-02 MED ORDER — ALPRAZOLAM 1 MG PO TABS: ORAL_TABLET | ORAL | Status: DC

## 2014-06-02 NOTE — Telephone Encounter (Signed)
Patient called requesting his Alprazolam to be refilled. Called into pharmacy. Patient Notified.

## 2014-06-07 ENCOUNTER — Telehealth: Payer: Self-pay

## 2014-06-07 NOTE — Telephone Encounter (Signed)
Patient has not had a BM for 5 days, no appetite for 5 days neither. Abdomin is not swollen, sore a little on left side. Having orange leakage from rectum. He is taking Miralax, but he isn't drinking much fluids, just to take pills with, told him to drink at least 32 oz of water.  I suggested he call Dr. Lynne Leader office (his GI), he is thinking of going to the new hospital close to home. I told him that was find.

## 2014-06-10 ENCOUNTER — Other Ambulatory Visit: Payer: Self-pay | Admitting: *Deleted

## 2014-06-10 MED ORDER — HYDROCODONE-ACETAMINOPHEN 7.5-325 MG PO TABS: 1.0000 | ORAL_TABLET | Freq: Four times a day (QID) | ORAL | Status: DC | PRN

## 2014-06-10 NOTE — Telephone Encounter (Signed)
Patient Requested and will pick up 

## 2014-06-21 ENCOUNTER — Telehealth: Payer: Self-pay | Admitting: Gastroenterology

## 2014-06-21 NOTE — Telephone Encounter (Signed)
Agree with plans as outlined.

## 2014-06-21 NOTE — Telephone Encounter (Signed)
Patient with new onset of constipation that started after a recent surgery and initiation of Norco.  Patient has not been taking his Miralax or Senokot.  I have advised him to take Norco as sparingly as possible and resume Miralax 2-3 times a day and take Senokot as needed.  He verbalized understanding.  He will call back for any additional questions or concerns.

## 2014-06-23 ENCOUNTER — Telehealth: Payer: Self-pay | Admitting: Gastroenterology

## 2014-06-23 ENCOUNTER — Other Ambulatory Visit: Payer: Self-pay | Admitting: Internal Medicine

## 2014-06-23 NOTE — Telephone Encounter (Signed)
Patient reports continued constipation despite Miralax, senokot and decreased narcotic use.  He will come in and see Arta Bruce, PA tomorrow at [facility]

## 2014-06-24 ENCOUNTER — Encounter: Payer: Self-pay | Admitting: Physician Assistant

## 2014-06-24 ENCOUNTER — Ambulatory Visit (INDEPENDENT_AMBULATORY_CARE_PROVIDER_SITE_OTHER): Payer: Medicare Other | Admitting: Physician Assistant

## 2014-06-24 VITALS — BP 118/80 | HR 91 | Ht 69.0 in | Wt 175.0 lb

## 2014-06-24 DIAGNOSIS — K5900 Constipation, unspecified: Secondary | ICD-10-CM

## 2014-06-25 ENCOUNTER — Encounter: Payer: Self-pay | Admitting: Physician Assistant

## 2014-06-25 ENCOUNTER — Telehealth: Payer: Self-pay | Admitting: Physician Assistant

## 2014-06-25 ENCOUNTER — Other Ambulatory Visit: Payer: Self-pay | Admitting: *Deleted

## 2014-06-25 MED ORDER — LUBIPROSTONE 24 MCG PO CAPS: 24.0000 ug | ORAL_CAPSULE | Freq: Two times a day (BID) | ORAL | Status: DC

## 2014-06-25 MED ORDER — LINACLOTIDE 145 MCG PO CAPS: 145.0000 ug | ORAL_CAPSULE | Freq: Every day | ORAL | Status: DC

## 2014-06-25 NOTE — Telephone Encounter (Signed)
Epic went down yesterday Logan Hayes

## 2014-06-25 NOTE — Progress Notes (Signed)
Patient ID: Logan Hayes, male   DOB: [birth year], 73 y.o.   MRN: [id number]     History of Present Illness:  Logan Hayes is a 73 year old male who has been followed here in the past for chronic constipation. He has been on narcotic medication on a regular basis for the past 3 years due to ongoing shoulder pain. He typically uses 3 or 4 hydrocodone spoke to per day. He is here today because he states he has not had a normal formed bowel movement in 7 days. Over the past week he has been passing orange stool per rectum with an occasional small nugget. He says he does not have the urge to have a bowel movement. He has no abdominal pain, he does not feel distended, and he has no nausea or vomiting. He's had no bright red blood per rectum and has not had any tarry stools. Past Medical History  Diagnosis Date  . Anxiety 02/14/1999  . Arthritis   . GERD (gastroesophageal reflux disease) 09/14/1998  . Glaucoma 02/13/2006  . Hyperlipidemia 04/04/2005  . Chronic joint pain   . IBS (irritable bowel syndrome)   . Erosive esophagitis   . Hiatal hernia   . Internal hemorrhoid   . Adenomatous polyp of colon 04/2004  . [name], shoulder region 12/05/2001  . Tension headache 02/13/1994  . Depressive disorder, not elsewhere classified 02/14/1972  . Unspecified tinnitus 1943/03/03  . Unspecified vitamin D deficiency   . Hypertrophy of prostate with urinary obstruction and other lower urinary tract symptoms (LUTS)   . Impotence of organic origin   . Insomnia, unspecified   . Attention or concentration deficit   . Tinnitus 1980    Past Surgical History  Procedure Laterality Date  . Ankle fracture surgery Left 2002    Dr. Gladstone Lighter  . Rotator cuff repair      left  . Vasectomy  1970  . Kidney stone surgery  1973   Family History  Problem Relation Age of Onset  . Colon cancer Neg Hx   . Stomach cancer Neg Hx   . Diabetes Mother   . Leukemia Mother   . Heart disease Father    History    Substance Use Topics  . Smoking status: Former Smoker    Quit date: 08/10/1979  . Smokeless tobacco: Never Used  . Alcohol Use: No   Current Outpatient Prescriptions  Medication Sig Dispense Refill  . ALPRAZolam (XANAX) 1 MG tablet Take one tablet by mouth up to three times daily as needed for nerves  90 tablet  0  . aspirin 81 MG tablet Take 81 mg by mouth daily. Take 1 tablet daily to prevent heart attack and stroke.      . citalopram (CELEXA) 40 MG tablet One daily for depression  30 tablet  3  . CRESTOR 10 MG tablet TAKE 1 TABLET EVERY DAY TO LOWER CHOLESTEROL  30 tablet  2  . FIBER FORMULA PO Take 625 mg by mouth 2 (two) times daily. Take 1 tablet twice daily.      . polyethylene glycol (MIRALAX / GLYCOLAX) packet Take 17 g by mouth daily.      Marland Kitchen HYDROcodone-acetaminophen (NORCO) 7.5-325 MG per tablet Take 1 tablet by mouth every 6 (six) hours as needed.  120 tablet  0   No current facility-administered medications for this visit.   Allergies  Allergen Reactions  . Mirtazapine Other (See Comments)    Restless, weak      Review  of Systems: Gen: Denies any fever, chills CV:neg Resp: neg GI: Denies vomiting blood, jaundice, and fecal incontinence.   Denies dysphagia or odynophagia. GU : Denies urinary burning, blood in urine, urinary frequency, urinary hesitancy, nocturnal urination, and urinary incontinence. MS:. neg Derm: neg Psych:neg Heme: neg Neuro: neg  Endo:  neg  PROCEDURES: Colonoscopy 03/18/12: 63mm sessile polyp ascending colon, 4 mm sessile polyp hepatic flexure, two 5-6 mm polyps transverse colon, internall hemorrhoids. Repeat colonoscopy 5 years.   Physical Exam: General: Pleasant, well developed , pleasant male in no acute distress Head: Normocephalic and atraumatic Eyes:  sclerae anicteric, conjunctiva pink  Ears: Normal auditory acuity Lungs: Clear throughout to auscultation Heart: Regular rate and rhythm Abdomen: Soft, non distended, non-tender.  No masses, no hepatomegaly. Normal bowel sounds Rectal: no stool in vault, no impaction appreciated Musculoskeletal: Symmetrical with no gross deformities  Extremities: No edema  Neurological: Alert oriented x 4, grossly nonfocal Psychological:  Alert and cooperative. Normal mood and affect  Assessment and Recommendations: 73 year old male with chronic constipation/opioid induced constipation here for evaluation. He has been advised to try to cut back on his narcotic use. He is to increase fiber and water intake in his diet he has been advised to use a MiraLAX purge tonight. He will be given a trial of Amit TZ 24 mcg twice daily. He has been asked to call us in 2 weeks to let us know if it is helping. He will followup in one month sooner if needed.        Damone Fancher, Deloris Ping 06/25/2014, Pager [phone]

## 2014-06-25 NOTE — Progress Notes (Signed)
Reviewed and agree with management plan.  Thursa Emme T. Sriram Febles, MD FACG 

## 2014-06-25 NOTE — Telephone Encounter (Signed)
Per Cecille Rubin send Linzess 145 mcg

## 2014-06-29 ENCOUNTER — Other Ambulatory Visit: Payer: Self-pay | Admitting: Internal Medicine

## 2014-07-02 ENCOUNTER — Telehealth: Payer: Self-pay | Admitting: Physician Assistant

## 2014-07-02 NOTE — Telephone Encounter (Signed)
I have spoken to [facility] and patient's Linzess has been approved until 07/03/15 (pt with constipation for longer than 3 months; has tried and failed senokot and miralax). She will put a PA in the system so the pharmacy can put the script through.

## 2014-07-02 NOTE — Telephone Encounter (Signed)
I have spoken to pharmacy and Linzess did go through for a 30 dollar copay. Patient advised.

## 2014-07-09 ENCOUNTER — Telehealth: Payer: Self-pay | Admitting: Physician Assistant

## 2014-07-09 DIAGNOSIS — K5909 Other constipation: Secondary | ICD-10-CM

## 2014-07-09 NOTE — Telephone Encounter (Signed)
Spoke with patient and he states he has not had a bowel movement in 2 weeks. He is taking Linzess daily. He is only passing liquid orange stool. He is passing gas. He states he is off his pain medications. He is asking if he should have an xray. Please, advise.

## 2014-07-09 NOTE — Telephone Encounter (Signed)
Is pt nauseous or vomiting? He can have an abd flat and upright--? Constipation.

## 2014-07-09 NOTE — Telephone Encounter (Signed)
Spoke with patient and he is not having nausea or vomiting. He states he cannot come for the xray until Monday.

## 2014-07-12 ENCOUNTER — Other Ambulatory Visit: Payer: Self-pay | Admitting: *Deleted

## 2014-07-12 ENCOUNTER — Ambulatory Visit (INDEPENDENT_AMBULATORY_CARE_PROVIDER_SITE_OTHER)
Admission: RE | Admit: 2014-07-12 | Discharge: 2014-07-12 | Disposition: A | Discharge status: Home / Self Care | Payer: Medicare Other | Source: Ambulatory Visit | Attending: Physician Assistant | Admitting: Physician Assistant

## 2014-07-12 ENCOUNTER — Encounter: Payer: Self-pay | Admitting: *Deleted

## 2014-07-12 DIAGNOSIS — K5909 Other constipation: Secondary | ICD-10-CM

## 2014-07-12 MED ORDER — PEG 3350-KCL-NA BICARB-NACL 420 G PO SOLR: ORAL | Status: DC

## 2014-07-12 MED ORDER — BISACODYL 5 MG PO TBEC: DELAYED_RELEASE_TABLET | ORAL | Status: DC

## 2014-07-12 MED ORDER — HYDROCODONE-ACETAMINOPHEN 7.5-325 MG PO TABS: 1.0000 | ORAL_TABLET | Freq: Four times a day (QID) | ORAL | Status: DC | PRN

## 2014-07-12 NOTE — Telephone Encounter (Signed)
Patient states he is doing horrible. He is still having orange liquid stool. He states he cannot come for xray because he is having loose orange stool all the time. Suggested he use a disposable diapers and come in but he states he does not have any and cannot go out. He states he does not have anyone to call to help him. He states he has used 17 enemas and is using suppositories too. Patient states he cannot come in. Explained to patient the importance to come for xray so we can help him. He continues to say he cannot come in.

## 2014-07-12 NOTE — Telephone Encounter (Signed)
Patient requested and will pick up 

## 2014-07-12 NOTE — Telephone Encounter (Signed)
Please see how he is feeling today and if he has moved his bowel.

## 2014-07-12 NOTE — Telephone Encounter (Signed)
Pt went for abd film and recommendations explained to pt by [facility].

## 2014-07-28 ENCOUNTER — Other Ambulatory Visit: Payer: Self-pay | Admitting: Internal Medicine

## 2014-08-04 ENCOUNTER — Ambulatory Visit (INDEPENDENT_AMBULATORY_CARE_PROVIDER_SITE_OTHER): Payer: Medicare Other | Admitting: Internal Medicine

## 2014-08-04 ENCOUNTER — Encounter: Payer: Self-pay | Admitting: Internal Medicine

## 2014-08-04 VITALS — BP 118/76 | HR 67 | Resp 10 | Ht 69.0 in | Wt 179.0 lb

## 2014-08-04 DIAGNOSIS — K5900 Constipation, unspecified: Secondary | ICD-10-CM

## 2014-08-04 DIAGNOSIS — H9313 Tinnitus, bilateral: Secondary | ICD-10-CM

## 2014-08-04 DIAGNOSIS — F329 Major depressive disorder, single episode, unspecified: Secondary | ICD-10-CM

## 2014-08-04 DIAGNOSIS — K649 Unspecified hemorrhoids: Secondary | ICD-10-CM | POA: Insufficient documentation

## 2014-08-04 DIAGNOSIS — F419 Anxiety disorder, unspecified: Secondary | ICD-10-CM

## 2014-08-04 DIAGNOSIS — F32A Depression, unspecified: Secondary | ICD-10-CM

## 2014-08-04 MED ORDER — NALOXEGOL OXALATE 25 MG PO TABS: 25.0000 mg | ORAL_TABLET | Freq: Every day | ORAL | Status: DC

## 2014-08-04 MED ORDER — VILAZODONE HCL 40 MG PO TABS: ORAL_TABLET | ORAL | Status: DC

## 2014-08-04 MED ORDER — ALPRAZOLAM 1 MG PO TABS: ORAL_TABLET | ORAL | Status: DC

## 2014-08-04 NOTE — Progress Notes (Signed)
Patient ID: Logan Hayes, male   DOB: 09/11/1940, 73 y.o.   MRN: [id number]    Facility  [facility]    Place of Service:   OFFICE   Allergies  Allergen Reactions  . Mirtazapine Other (See Comments)    Restless, weak    Chief Complaint  Patient presents with  . Medical Management of Chronic Issues    3 month follow-up  . Bowel Concerns    Patient c/o bowel problems     HPI:  Constipation, unspecified constipation type: contines to have problems. Erratic stools.  Anxiety: would like to have Xanax increased.  Depression: not doing well on citalopram  Hemorrhoids, unspecified hemorrhoid type: irritated  Tinnitus, bilateral: having a terrible time    Medications: Patient's Medications  New Prescriptions   No medications on file  Previous Medications   ALPRAZOLAM (XANAX) 1 MG TABLET    TAKE 1 TABLET BY MOUTH UP TO THREE TIMES A DAY AS NEEDED FOR NERVES   ASPIRIN 81 MG TABLET    Take 81 mg by mouth daily. Take 1 tablet daily to prevent heart attack and stroke.   CITALOPRAM (CELEXA) 40 MG TABLET    One daily for depression   CRESTOR 10 MG TABLET    TAKE 1 TABLET EVERY DAY TO LOWER CHOLESTEROL   HYDROCODONE-ACETAMINOPHEN (NORCO) 7.5-325 MG PER TABLET    Take 1 tablet by mouth every 6 (six) hours as needed.  Modified Medications   No medications on file  Discontinued Medications   BISACODYL (DULCOLAX) 5 MG EC TABLET    Take 3 tablets po prior to Golytely   FIBER FORMULA PO    Take 625 mg by mouth 2 (two) times daily. Take 1 tablet twice daily.   LINACLOTIDE (LINZESS) 145 MCG CAPS CAPSULE    Take 1 capsule (145 mcg total) by mouth daily.   LUBIPROSTONE (AMITIZA) 24 MCG CAPSULE    Take 1 capsule (24 mcg total) by mouth 2 (two) times daily with a meal.   POLYETHYLENE GLYCOL (MIRALAX / GLYCOLAX) PACKET    Take 17 g by mouth daily.   POLYETHYLENE GLYCOL-ELECTROLYTES (NULYTELY/GOLYTELY) 420 G SOLUTION    Mix and drink 8 ounces every 15 minutes until gone.     Review of Systems   Constitutional: Positive for activity change and fatigue. Negative for fever, chills and appetite change.  HENT: Negative for ear discharge and ear pain.        Severe tinnitus  Eyes: Negative.   Respiratory: Negative for apnea, cough, shortness of breath and wheezing.   Cardiovascular: Negative for chest pain, palpitations and leg swelling.  Gastrointestinal: Positive for constipation. Negative for nausea, abdominal pain, diarrhea, abdominal distention and rectal pain.  Endocrine: Negative.   Genitourinary:       Slow, weak, stream  Musculoskeletal: Positive for arthralgias (right ankle and shoulders).  Allergic/Immunologic: Negative.   Neurological: Negative.   Hematological: Negative.   Psychiatric/Behavioral: Positive for sleep disturbance and decreased concentration. The patient is nervous/anxious.        Dyshporic mood with hx of depressioin    Filed Vitals:   08/04/14 1337  BP: 118/76  Pulse: 67  Resp: 10  Height: 5\' 9"  (1.753 m)  Weight: 179 lb (81.194 kg)  SpO2: 97%   Body mass index is 26.42 kg/(m^2).  Physical Exam  Constitutional: He is oriented to person, place, and time. He appears well-developed and well-nourished. No distress.  HENT:  Right Ear: External ear normal.  Left Ear: External ear  normal.  Nose: Nose normal.  Mouth/Throat: Oropharynx is clear and moist. No oropharyngeal exudate.  Eyes: Conjunctivae and EOM are normal. Pupils are equal, round, and reactive to light.  Neck: No JVD present. No tracheal deviation present. No thyromegaly present.  Cardiovascular: Normal rate, regular rhythm, normal heart sounds and intact distal pulses.  Exam reveals no gallop and no friction rub.   No murmur heard. Pulmonary/Chest: No respiratory distress. He has no wheezes. He has no rales. He exhibits no tenderness.  Abdominal: He exhibits no distension and no mass. There is no tenderness.  No inguinal adenopathy  Genitourinary:  External skin tag and internal  hemorrhoid  Musculoskeletal: Normal range of motion. He exhibits tenderness (shoulders and right ankle). He exhibits no edema.  Lymphadenopathy:    He has no cervical adenopathy.  Neurological: He is alert and oriented to person, place, and time. He has normal reflexes. No cranial nerve deficit. Coordination normal.  12/06/10 MMSE 27/30. Passed clock drawing.  Skin: No rash noted. No erythema. No pallor.  Psychiatric: His behavior is normal. Thought content normal.     Labs reviewed: No visits with results within 3 Month(s) from this visit. Latest known visit with results is:  Appointment on 04/26/2014  Component Date Value Ref Range Status  . Cholesterol, Total 04/26/2014 194  100 - 199 mg/dL Final  . Triglycerides 04/26/2014 281* 0 - 149 mg/dL Final  . HDL 04/26/2014 49  >39 mg/dL Final   Comment: According to ATP-III Guidelines, HDL-C >59 mg/dL is considered a                          negative risk factor for CHD.  Marland Kitchen VLDL Cholesterol Cal 04/26/2014 56* 5 - 40 mg/dL Final  . LDL Calculated 04/26/2014 89  0 - 99 mg/dL Final  . Chol/HDL Ratio 04/26/2014 4.0  0.0 - 5.0 ratio units Final   Comment:                                   T. Chol/HDL Ratio                                                                      Men  Women                                                        1/2 Avg.Risk  3.4    3.3                                                            Avg.Risk  5.0    4.4  2X Avg.Risk  9.6    7.1                                                         3X Avg.Risk 23.4   11.0  . Glucose 04/26/2014 92  65 - 99 mg/dL Final  . BUN 04/26/2014 14  8 - 27 mg/dL Final  . Creatinine, Ser 04/26/2014 1.52* 0.76 - 1.27 mg/dL Final  . GFR calc non Af Amer 04/26/2014 45* >59 mL/min/1.73 Final  . GFR calc Af Amer 04/26/2014 53* >59 mL/min/1.73 Final  . BUN/Creatinine Ratio 04/26/2014 9* 10 - 22 Final  . Sodium 04/26/2014 140  134 -  144 mmol/L Final  . Potassium 04/26/2014 4.4  3.5 - 5.2 mmol/L Final  . Chloride 04/26/2014 100  97 - 108 mmol/L Final  . CO2 04/26/2014 21  18 - 29 mmol/L Final  . Calcium 04/26/2014 9.4  8.6 - 10.2 mg/dL Final  . Total Protein 04/26/2014 6.7  6.0 - 8.5 g/dL Final  . Albumin 04/26/2014 4.3  3.5 - 4.8 g/dL Final  . Globulin, Total 04/26/2014 2.4  1.5 - 4.5 g/dL Final  . Albumin/Globulin Ratio 04/26/2014 1.8  1.1 - 2.5 Final  . Total Bilirubin 04/26/2014 0.3  0.0 - 1.2 mg/dL Final  . Alkaline Phosphatase 04/26/2014 58  39 - 117 IU/L Final  . AST 04/26/2014 15  0 - 40 IU/L Final  . ALT 04/26/2014 25  0 - 44 IU/L Final     Assessment/Plan 1. Constipation, unspecified constipation type - Naloxegol Oxalate (MOVANTIK) 25 MG TABS; Take 25 mg by mouth daily at 8 pm. One daily to help constipation  Dispense: 30 tablet; Refill: 3  2. Anxiety - ALPRAZolam (XANAX) 1 MG tablet; One up to 4 times daily to help anxiety  Dispense: 120 tablet; Refill: 5  3. Depression - Vilazodone HCl (VIIBRYD) 40 MG TABS; One daily to help depression  Dispense: 30 tablet; Refill: 3  4. Hemorrhoids, unspecified hemorrhoid type Use moistened wipes  5. Tinnitus, bilateral Continue masking with music

## 2014-08-11 ENCOUNTER — Other Ambulatory Visit: Payer: Self-pay | Admitting: *Deleted

## 2014-08-11 MED ORDER — HYDROCODONE-ACETAMINOPHEN 7.5-325 MG PO TABS: 1.0000 | ORAL_TABLET | Freq: Four times a day (QID) | ORAL | Status: DC | PRN

## 2014-08-11 NOTE — Telephone Encounter (Signed)
Patient Requested and will pick up 

## 2014-08-16 ENCOUNTER — Other Ambulatory Visit: Payer: Self-pay | Admitting: Internal Medicine

## 2014-08-23 ENCOUNTER — Other Ambulatory Visit: Payer: Self-pay | Admitting: Internal Medicine

## 2014-08-28 ENCOUNTER — Emergency Department (HOSPITAL_COMMUNITY)
Admission: EM | Admit: 2014-08-28 | Discharge: 2014-08-28 | Disposition: A | Discharge status: Home / Self Care | Payer: Medicare Other | Attending: Emergency Medicine | Admitting: Emergency Medicine

## 2014-08-28 ENCOUNTER — Emergency Department (HOSPITAL_COMMUNITY): Payer: Medicare Other

## 2014-08-28 ENCOUNTER — Encounter (HOSPITAL_COMMUNITY): Payer: Self-pay | Admitting: Emergency Medicine

## 2014-08-28 DIAGNOSIS — Z8639 Personal history of other endocrine, nutritional and metabolic disease: Secondary | ICD-10-CM | POA: Insufficient documentation

## 2014-08-28 DIAGNOSIS — Z87448 Personal history of other diseases of urinary system: Secondary | ICD-10-CM | POA: Insufficient documentation

## 2014-08-28 DIAGNOSIS — G8929 Other chronic pain: Secondary | ICD-10-CM | POA: Diagnosis not present

## 2014-08-28 DIAGNOSIS — Y9389 Activity, other specified: Secondary | ICD-10-CM | POA: Insufficient documentation

## 2014-08-28 DIAGNOSIS — Z23 Encounter for immunization: Secondary | ICD-10-CM | POA: Insufficient documentation

## 2014-08-28 DIAGNOSIS — Y92017 Garden or yard in single-family (private) house as the place of occurrence of the external cause: Secondary | ICD-10-CM | POA: Insufficient documentation

## 2014-08-28 DIAGNOSIS — Z8669 Personal history of other diseases of the nervous system and sense organs: Secondary | ICD-10-CM | POA: Diagnosis not present

## 2014-08-28 DIAGNOSIS — Z87891 Personal history of nicotine dependence: Secondary | ICD-10-CM | POA: Diagnosis not present

## 2014-08-28 DIAGNOSIS — Z7982 Long term (current) use of aspirin: Secondary | ICD-10-CM | POA: Diagnosis not present

## 2014-08-28 DIAGNOSIS — X788XXA Intentional self-harm by other sharp object, initial encounter: Secondary | ICD-10-CM | POA: Insufficient documentation

## 2014-08-28 DIAGNOSIS — R443 Hallucinations, unspecified: Secondary | ICD-10-CM | POA: Diagnosis present

## 2014-08-28 DIAGNOSIS — S61219A Laceration without foreign body of unspecified finger without damage to nail, initial encounter: Secondary | ICD-10-CM

## 2014-08-28 DIAGNOSIS — M1990 Unspecified osteoarthritis, unspecified site: Secondary | ICD-10-CM | POA: Insufficient documentation

## 2014-08-28 DIAGNOSIS — Z79899 Other long term (current) drug therapy: Secondary | ICD-10-CM | POA: Insufficient documentation

## 2014-08-28 DIAGNOSIS — Z8719 Personal history of other diseases of the digestive system: Secondary | ICD-10-CM | POA: Insufficient documentation

## 2014-08-28 DIAGNOSIS — R4182 Altered mental status, unspecified: Secondary | ICD-10-CM | POA: Insufficient documentation

## 2014-08-28 DIAGNOSIS — Y998 Other external cause status: Secondary | ICD-10-CM | POA: Insufficient documentation

## 2014-08-28 DIAGNOSIS — S61213A Laceration without foreign body of left middle finger without damage to nail, initial encounter: Secondary | ICD-10-CM | POA: Insufficient documentation

## 2014-08-28 DIAGNOSIS — Z86010 Personal history of colonic polyps: Secondary | ICD-10-CM | POA: Diagnosis not present

## 2014-08-28 LAB — CBC WITH DIFFERENTIAL/PLATELET
Basophils Absolute: 0 10*3/uL (ref 0.0–0.1)
Basophils Relative: 0 % (ref 0–1)
EOS ABS: 0 10*3/uL (ref 0.0–0.7)
EOS PCT: 0 % (ref 0–5)
HEMATOCRIT: 42.9 % (ref 39.0–52.0)
Hemoglobin: 14 g/dL (ref 13.0–17.0)
LYMPHS ABS: 1.1 10*3/uL (ref 0.7–4.0)
LYMPHS PCT: 14 % (ref 12–46)
MCH: 30.2 pg (ref 26.0–34.0)
MCHC: 32.6 g/dL (ref 30.0–36.0)
MCV: 92.5 fL (ref 78.0–100.0)
MONO ABS: 1.2 10*3/uL — AB (ref 0.1–1.0)
Monocytes Relative: 15 % — ABNORMAL HIGH (ref 3–12)
Neutro Abs: 5.3 10*3/uL (ref 1.7–7.7)
Neutrophils Relative %: 71 % (ref 43–77)
Platelets: 227 10*3/uL (ref 150–400)
RBC: 4.64 MIL/uL (ref 4.22–5.81)
RDW: 14.5 % (ref 11.5–15.5)
WBC: 7.6 10*3/uL (ref 4.0–10.5)

## 2014-08-28 LAB — URINALYSIS, ROUTINE W REFLEX MICROSCOPIC
Bilirubin Urine: NEGATIVE
Glucose, UA: NEGATIVE mg/dL
Hgb urine dipstick: NEGATIVE
Ketones, ur: 40 mg/dL — AB
LEUKOCYTES UA: NEGATIVE
Nitrite: NEGATIVE
Protein, ur: NEGATIVE mg/dL
Specific Gravity, Urine: 1.021 (ref 1.005–1.030)
UROBILINOGEN UA: 0.2 mg/dL (ref 0.0–1.0)
pH: 7.5 (ref 5.0–8.0)

## 2014-08-28 LAB — RAPID URINE DRUG SCREEN, HOSP PERFORMED
Amphetamines: NOT DETECTED
Barbiturates: NOT DETECTED
Benzodiazepines: POSITIVE — AB
COCAINE: NOT DETECTED
Opiates: POSITIVE — AB
TETRAHYDROCANNABINOL: NOT DETECTED

## 2014-08-28 LAB — COMPREHENSIVE METABOLIC PANEL
ALT: 19 U/L (ref 0–53)
AST: 31 U/L (ref 0–37)
Albumin: 4.3 g/dL (ref 3.5–5.2)
Alkaline Phosphatase: 49 U/L (ref 39–117)
Anion gap: 9 (ref 5–15)
BUN: 17 mg/dL (ref 6–23)
CALCIUM: 9 mg/dL (ref 8.4–10.5)
CO2: 23 mmol/L (ref 19–32)
CREATININE: 1.33 mg/dL (ref 0.50–1.35)
Chloride: 105 mEq/L (ref 96–112)
GFR calc non Af Amer: 52 mL/min — ABNORMAL LOW (ref >90)
GFR, EST AFRICAN AMERICAN: 60 mL/min — AB (ref >90)
GLUCOSE: 98 mg/dL (ref 70–99)
Potassium: 3.6 mmol/L (ref 3.5–5.1)
Sodium: 137 mmol/L (ref 135–145)
TOTAL PROTEIN: 7.1 g/dL (ref 6.0–8.3)
Total Bilirubin: 0.7 mg/dL (ref 0.3–1.2)

## 2014-08-28 LAB — ACETAMINOPHEN LEVEL: Acetaminophen (Tylenol), Serum: <10 ug/mL — ABNORMAL LOW (ref 10–30)

## 2014-08-28 LAB — SALICYLATE LEVEL: Salicylate Lvl: <4 mg/dL (ref 2.8–20.0)

## 2014-08-28 LAB — ETHANOL

## 2014-08-28 MED ORDER — LIDOCAINE-EPINEPHRINE 1 %-1:100000 IJ SOLN
10.0000 mL | Freq: Once | INTRAMUSCULAR | Status: DC
  Filled 2014-08-28: qty 1

## 2014-08-28 MED ORDER — LIDOCAINE HCL 1 % IJ SOLN
INTRAMUSCULAR | Status: AC
  Administered 2014-08-28: 12:00:00
  Filled 2014-08-28: qty 20

## 2014-08-28 MED ORDER — TETANUS-DIPHTH-ACELL PERTUSSIS 5-2.5-18.5 LF-MCG/0.5 IM SUSP
0.5000 mL | Freq: Once | INTRAMUSCULAR | Status: AC
  Administered 2014-08-28: 0.5 mL via INTRAMUSCULAR
  Filled 2014-08-28: qty 0.5

## 2014-08-28 NOTE — ED Notes (Signed)
Dr. Wofford at bedside 

## 2014-08-28 NOTE — ED Notes (Signed)
Patient transported to CT 

## 2014-08-28 NOTE — ED Provider Notes (Signed)
CSN: [id number]     Arrival date & time 08/28/14  8469 History   First MD Initiated Contact with Patient 08/28/14 0700     Chief Complaint  Patient presents with  . Hallucinations  . Hand Injury     (Consider location/radiation/quality/duration/timing/severity/associated sxs/prior Treatment) Patient is a 73 y.o. male presenting with altered mental status.  Altered Mental Status Presenting symptoms: confusion   Presenting symptoms comment:  Hallucinations Severity:  Moderate Most recent episode: 12 hours ago. Episode history:  Single Duration: "about 2 hours" Timing:  Constant Progression:  Improving Chronicity:  New Context: not alcohol use (denies) and not drug use (denies)   Associated symptoms: hallucinations   Associated symptoms: no fever, no nausea and no vomiting   Associated symptoms comment:  Left hand injury   Past Medical History  Diagnosis Date  . Anxiety 02/14/1999  . Arthritis   . GERD (gastroesophageal reflux disease) 09/14/1998  . Glaucoma 02/13/2006  . Hyperlipidemia 04/04/2005  . Chronic joint pain   . IBS (irritable bowel syndrome)   . Erosive esophagitis   . Hiatal hernia   . Internal hemorrhoid   . Adenomatous polyp of colon 04/2004  . [name], shoulder region 12/05/2001  . Tension headache 02/13/1994  . Depressive disorder, not elsewhere classified 02/14/1972  . Unspecified tinnitus 02-28-43  . Unspecified vitamin D deficiency   . Hypertrophy of prostate with urinary obstruction and other lower urinary tract symptoms (LUTS)   . Impotence of organic origin   . Insomnia, unspecified   . Attention or concentration deficit   . Tinnitus 1980   Past Surgical History  Procedure Laterality Date  . Ankle fracture surgery Left 2002    Dr. Gladstone Lighter  . Rotator cuff repair      left  . Vasectomy  1970  . Kidney stone surgery  1973   Family History  Problem Relation Age of Onset  . Colon cancer Neg Hx   . Stomach cancer Neg Hx   .  Diabetes Mother   . Leukemia Mother   . Heart disease Father    History  Substance Use Topics  . Smoking status: Former Smoker    Quit date: 08/10/1979  . Smokeless tobacco: Never Used  . Alcohol Use: No    Review of Systems  Constitutional: Negative for fever.  Gastrointestinal: Negative for nausea and vomiting.  Psychiatric/Behavioral: Positive for hallucinations and confusion.  All other systems reviewed and are negative.     Allergies  Mirtazapine  Home Medications   Prior to Admission medications   Medication Sig Start Date End Date Taking? Authorizing Provider  ALPRAZolam Duanne Moron) 1 MG tablet One up to 4 times daily to help anxiety Patient taking differently: Take 1 mg by mouth 3 (three) times daily as needed for anxiety.  08/04/14  Yes Estill Dooms, MD  aspirin 81 MG tablet Take 81 mg by mouth daily. Take 1 tablet daily to prevent heart attack and stroke.   Yes Historical Provider, MD  CRESTOR 10 MG tablet TAKE 1 TABLET EVERY DAY TO LOWER CHOLESTEROL 06/24/14  Yes Estill Dooms, MD  HYDROcodone-acetaminophen (NORCO) 7.5-325 MG per tablet Take 1 tablet by mouth every 6 (six) hours as needed. 08/11/14  Yes Estill Dooms, MD  CRESTOR 10 MG tablet TAKE 1 TABLET EVERY DAY TO LOWER CHOLESTEROL Patient not taking: Reported on 08/28/2014 08/16/14   Estill Dooms, MD  Naloxegol Oxalate (MOVANTIK) 25 MG TABS Take 25 mg by mouth daily at 8  pm. One daily to help constipation 08/04/14   Estill Dooms, MD  Vilazodone HCl (VIIBRYD) 40 MG TABS One daily to help depression Patient not taking: Reported on 08/28/2014 08/04/14   Estill Dooms, MD   BP 135/88 mmHg  Pulse 88  Temp(Src) 98.6 F (37 C) (Oral)  Resp 19  SpO2 98% Physical Exam  Constitutional: He is oriented to person, place, and time. He appears well-developed and well-nourished. No distress.  HENT:  Head: Normocephalic and atraumatic.  Mouth/Throat: Oropharynx is clear and moist.  Eyes: Conjunctivae are normal.  Pupils are equal, round, and reactive to light. No scleral icterus.  Neck: Neck supple.  Cardiovascular: Normal rate, regular rhythm, normal heart sounds and intact distal pulses.   No murmur heard. Pulmonary/Chest: Effort normal and breath sounds normal. No stridor. No respiratory distress. He has no wheezes. He has no rales.  Abdominal: Soft. He exhibits no distension. There is no tenderness.  Musculoskeletal: Normal range of motion. He exhibits no edema.  Neurological: He is alert and oriented to person, place, and time. He has normal strength. No cranial nerve deficit or sensory deficit. Coordination (very slight difficulty with finger to nose on left. otherwise intact.) abnormal. Gait normal. GCS eye subscore is 4. GCS verbal subscore is 5. GCS motor subscore is 6.  Slight delay with questions of orientation to time.   Skin: Skin is warm and dry. No rash noted.  Psychiatric: He has a normal mood and affect. His behavior is normal. He is not actively hallucinating.  Nursing note and vitals reviewed.   ED Course  LACERATION REPAIR Date/Time: 08/28/2014 4:41 PM Performed by: Artis Delay Authorized by: Artis Delay Consent: Verbal consent obtained. Risks and benefits: risks, benefits and alternatives were discussed Consent given by: patient Body area: upper extremity Location details: left long finger Laceration length: 2 cm Foreign bodies: no foreign bodies Tendon involvement: none Nerve involvement: none Vascular damage: no Anesthesia: digital block Local anesthetic: lidocaine 1% without epinephrine Anesthetic total: 5 ml Preparation: Patient was prepped and draped in the usual sterile fashion. Irrigation solution: saline Irrigation method: jet lavage Amount of cleaning: standard Debridement: none Degree of undermining: none Skin closure: 5-0 Prolene Number of sutures: 3 Technique: simple Approximation: close Approximation difficulty: simple Patient tolerance: Patient  tolerated the procedure well with no immediate complications   (including critical care time) Labs Review Labs Reviewed  CBC WITH DIFFERENTIAL - Abnormal; Notable for the following:    Monocytes Relative 15 (*)    Monocytes Absolute 1.2 (*)    All other components within normal limits  COMPREHENSIVE METABOLIC PANEL - Abnormal; Notable for the following:    GFR calc non Af Amer 52 (*)    GFR calc Af Amer 60 (*)    All other components within normal limits  URINALYSIS, ROUTINE W REFLEX MICROSCOPIC - Abnormal; Notable for the following:    Ketones, ur 40 (*)    All other components within normal limits  ACETAMINOPHEN LEVEL - Abnormal; Notable for the following:    Acetaminophen (Tylenol), Serum <10.0 (*)    All other components within normal limits  URINE RAPID DRUG SCREEN (HOSP PERFORMED) - Abnormal; Notable for the following:    Opiates POSITIVE (*)    Benzodiazepines POSITIVE (*)    All other components within normal limits  ETHANOL  SALICYLATE LEVEL  LACTIC ACID, PLASMA    Imaging Review Ct Head Wo Contrast  08/28/2014   CLINICAL DATA:  Recent behavioral changes.  EXAM: CT  HEAD WITHOUT CONTRAST  TECHNIQUE: Contiguous axial images were obtained from the base of the skull through the vertex without intravenous contrast.  COMPARISON:  None.  FINDINGS: Ventricles, cisterns and other CSF spaces are within normal. There is no mass, mass effect, shift of midline structures or acute hemorrhage. No evidence to suggest acute infarction. Remaining bones and soft tissues are unremarkable.  IMPRESSION: No acute intracranial findings.   Electronically Signed   By: Marin Olp M.D.   On: 08/28/2014 08:14  All radiology studies independently viewed by me.      EKG Interpretation   Date/Time:  Saturday August 28 2014 07:54:49 EST Ventricular Rate:  82 PR Interval:  133 QRS Duration: 75 QT Interval:  370 QTC Calculation: 432 R Axis:   47 Text Interpretation:  Sinus rhythm No  significant change was found  Confirmed by [facility]  MD, TREY ([id number]) on 08/28/2014 9:25:36 AM      MDM   Final diagnoses:  Altered mental status  Hallucinations  Finger laceration, initial encounter    73 yo male presenting with EMS after police responded to scene because pt was actively hallucinating.  Pt reports that he was showing a house when he noticed small creatures in the yard.  He stated that they looked like rabbits that weren't "put together correctly".  He denies being frightened by them.  They were also in his house at one point.  He states that he called the police himself.  He admits to having a firearm on him, but denies that he used it or planned to use it.    Currently, he is still slightly confused, but now realizes that the creatures that he saw were not real.    After monitoring in the ED, he remained well appearing.  Sensorium cleared slightly.  Still had memory difficulty (which he apparently has a history of according to medical record.)  TTS evaluated him and recommended outpatient treatment.  Pt appeared to be very functional, although with decreased memory.  Psychiatry spoke with one of his friends who will go by his house to check on him.  I spoke with Dr. Bubba Camp, on call for [facility] who will review his case with Dr. Nyoka Cowden for close follow up.  I suspect that he is at his baseline mental status.  He is no longer hallucinating.  I repaired his finger.  He'll need sutures removed in about a week.    Artis Delay, MD 08/28/14 [phone]

## 2014-08-28 NOTE — ED Notes (Signed)
Alerted MD to pt need for transportation home. MD states it's okay if pt can take a cab. Attempted to call pt family members, without any answer. Pt states he has money to take a cab home, [employer] called and states they would arrive here within 10 minutes. Pt escorted voluntarily to lobby to wait for cab at this time.

## 2014-08-28 NOTE — ED Notes (Signed)
Pt getting aggitated with wait time. MD notifed

## 2014-08-28 NOTE — ED Notes (Signed)
Pt getting agitated with wait time. Alerted MD, he stated we were awaiting a decision from a consulted provider. Two [employer] employees from psych have been in the room to talk with the patient. MD aware of agitation, states no medical need for IVC at this time. Awaiting psych at this time. Pt states he needs to go home to take care of some things and will only be waiting a little longer. States no other needs at this time.
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
## 2014-08-28 NOTE — ED Notes (Signed)
MD Wofford at bedside for suture placement.

## 2014-08-28 NOTE — ED Notes (Signed)
Bed: ZJ69 Expected date:  Expected time:  Means of arrival:  Comments: EMS 71M lac/hallucinations

## 2014-08-28 NOTE — BH Assessment (Signed)
Assessment Note  Logan Hayes is an 73 y.o. male. Patient was brought into the ED by [facility] because of visual hallucinations. Patient reports he has never experienced hallucinations in the past but today he saw a clothe on the floor that moved every time he got close to it.  He reports it continued to move until he cornered it in the bathroom then he said it disappeared.  He reports seeing this same object floating across the floor multiple times then he called 911 for help. Patient denies SI/HI and other self-injurious behaviors.  Patient denies any currently alcohol and drug use.    Patient was very evasive about providing contact information for people in his support system.  Patient reports his younger brother lives behind him but do not want to bother him, do not want to bother is son, but gave friend's number Delores [phone].  CSW attempted to get in touch Delores the friend but only able to leave a message.    CSW consulted with Theodoro Clock, NP and Dr. Doy Mince it is recommended to discharged with outpatient referrals.    Axis I: Psychotic Disorder NOS Axis II: Deferred Axis III:  Past Medical History  Diagnosis Date  . Anxiety 02/14/1999  . Arthritis   . GERD (gastroesophageal reflux disease) 09/14/1998  . Glaucoma 02/13/2006  . Hyperlipidemia 04/04/2005  . Chronic joint pain   . IBS (irritable bowel syndrome)   . Erosive esophagitis   . Hiatal hernia   . Internal hemorrhoid   . Adenomatous polyp of colon 04/2004  . [name], shoulder region 12/05/2001  . Tension headache 02/13/1994  . Depressive disorder, not elsewhere classified 02/14/1972  . Unspecified tinnitus 1942/09/07  . Unspecified vitamin D deficiency   . Hypertrophy of prostate with urinary obstruction and other lower urinary tract symptoms (LUTS)   . Impotence of organic origin   . Insomnia, unspecified   . Attention or concentration deficit   . Tinnitus 1980   Axis IV: housing problems, other  psychosocial or environmental problems, problems related to social environment, problems with access to health care services and problems with primary support group Axis V: 41-50 serious symptoms  Past Medical History:  Past Medical History  Diagnosis Date  . Anxiety 02/14/1999  . Arthritis   . GERD (gastroesophageal reflux disease) 09/14/1998  . Glaucoma 02/13/2006  . Hyperlipidemia 04/04/2005  . Chronic joint pain   . IBS (irritable bowel syndrome)   . Erosive esophagitis   . Hiatal hernia   . Internal hemorrhoid   . Adenomatous polyp of colon 04/2004  . [name], shoulder region 12/05/2001  . Tension headache 02/13/1994  . Depressive disorder, not elsewhere classified 02/14/1972  . Unspecified tinnitus 04-25-43  . Unspecified vitamin D deficiency   . Hypertrophy of prostate with urinary obstruction and other lower urinary tract symptoms (LUTS)   . Impotence of organic origin   . Insomnia, unspecified   . Attention or concentration deficit   . Tinnitus 1980    Past Surgical History  Procedure Laterality Date  . Ankle fracture surgery Left 2002    Dr. Gladstone Lighter  . Rotator cuff repair      left  . Vasectomy  1970  . Kidney stone surgery  1973    Family History:  Family History  Problem Relation Age of Onset  . Colon cancer Neg Hx   . Stomach cancer Neg Hx   . Diabetes Mother   . Leukemia Mother   . Heart disease  Father     Social History:  reports that he quit smoking about 35 years ago. He has never used smokeless tobacco. He reports that he does not drink alcohol or use illicit drugs.  Additional Social History:     CIWA: CIWA-Ar BP: 135/88 mmHg Pulse Rate: 88 COWS:    Allergies:  Allergies  Allergen Reactions  . Mirtazapine Other (See Comments)    Restless, weak    Home Medications:  (Not in a hospital admission)  OB/GYN Status:  No LMP for male patient.  General Assessment Data Location of Assessment: [facility] ED ACT Assessment: Yes Is this a  Tele or Face-to-Face Assessment?: Face-to-Face Is this an Initial Assessment or a Re-assessment for this encounter?: Initial Assessment Living Arrangements: Alone Can pt return to current living arrangement?: Yes Admission Status: Voluntary Is patient capable of signing voluntary admission?: Yes Transfer from: Home Referral Source: Self/Family/Friend  Medical Screening Exam ([place]) Medical Exam completed: Yes  [place] Living Arrangements: Alone Name of Psychiatrist: none Name of Therapist: none  Education Status Is patient currently in school?: No  Risk to self with the past 6 months Suicidal Ideation: No-Not Currently/Within Last 6 Months Suicidal Intent: No-Not Currently/Within Last 6 Months Is patient at risk for suicide?: No Suicidal Plan?: No-Not Currently/Within Last 6 Months Access to Means: No What has been your use of drugs/alcohol within the last 12 months?: denies Previous Attempts/Gestures: No Intentional Self Injurious Behavior: None Family Suicide History: No Recent stressful life event(s): Conflict (Comment), Loss (Comment) Persecutory voices/beliefs?: No Depression: Yes Depression Symptoms: Tearfulness, Isolating, Feeling angry/irritable Substance abuse history and/or treatment for substance abuse?: No (denies)  Risk to Others within the past 6 months Homicidal Ideation: No-Not Currently/Within Last 6 Months Thoughts of Harm to Others: No-Not Currently Present/Within Last 6 Months Current Homicidal Intent: No-Not Currently/Within Last 6 Months Current Homicidal Plan: No-Not Currently/Within Last 6 Months Access to Homicidal Means: No History of harm to others?: No Assessment of Violence: None Noted Does patient have access to weapons?: Yes (Comment) Criminal Charges Pending?: No Does patient have a court date: No  Psychosis Hallucinations: Visual Delusions: None noted  Mental Status Report Appear/Hygiene: Unremarkable Eye  Contact: Fair Motor Activity: Unremarkable Speech: Pressured Level of Consciousness: Alert Mood: Anxious, Depressed Affect: Anxious, Depressed Anxiety Level: Moderate Thought Processes: Flight of Ideas Judgement: Partial Orientation: Person, Place, Time, Situation Obsessive Compulsive Thoughts/Behaviors: None  Cognitive Functioning Concentration: Poor Memory: Recent Intact, Remote Intact IQ: Average Insight: Fair Impulse Control: Fair Appetite: Poor Sleep: Decreased Vegetative Symptoms: None  ADLScreening [facility] Assessment Services) Patient's cognitive ability adequate to safely complete daily activities?: Yes Patient able to express need for assistance with ADLs?: Yes Independently performs ADLs?: Yes (appropriate for developmental age)  Prior Inpatient Therapy Prior Inpatient Therapy: No  Prior Outpatient Therapy Prior Outpatient Therapy: No  ADL Screening (condition at time of admission) Patient's cognitive ability adequate to safely complete daily activities?: Yes Patient able to express need for assistance with ADLs?: Yes Independently performs ADLs?: Yes (appropriate for developmental age)             Advance Directives (For Healthcare) Does patient have an advance directive?: No Would patient like information on creating an advanced directive?: No - patient declined information    Additional Information 1:1 In Past 12 Months?: No CIRT Risk: No Elopement Risk: No Does patient have medical clearance?: No     Disposition:  Disposition Initial Assessment Completed for this Encounter: Yes Disposition of Patient:  Other dispositions (Pending deposition) Other disposition(s): Other (Comment) (Pending disposition)  On Site Evaluation by:   Reviewed with Physician:    Chesley Noon A 08/28/2014 1:32 PM

## 2014-08-28 NOTE — ED Notes (Signed)
Pt transported to CT ?

## 2014-08-28 NOTE — Discharge Instructions (Signed)
Confusion Confusion is the inability to think with your usual speed or clarity. Confusion may come on quickly or slowly over time. How quickly the confusion comes on depends on the cause. Confusion can be due to any number of causes. CAUSES   Concussion, head injury, or head trauma.  Seizures.  Stroke.  Fever.  Brain tumor.  Age related decreased brain function (dementia).  Heightened emotional states like rage or terror.  Mental illness in which the person loses the ability to determine what is real and what is not (hallucinations).  Infections such as a urinary tract infection (UTI).  Toxic effects from alcohol, drugs, or prescription medicines.  Dehydration and an imbalance of salts in the body (electrolytes).  Lack of sleep.  Low blood sugar (diabetes).  Low levels of oxygen from conditions such as chronic lung disorders.  Drug interactions or other medicine side effects.  Nutritional deficiencies, especially niacin, thiamine, vitamin C, or vitamin B.  Sudden drop in body temperature (hypothermia).  Change in routine, such as when traveling or hospitalized. SIGNS AND SYMPTOMS  People often describe their thinking as cloudy or unclear when they are confused. Confusion can also include feeling disoriented. That means you are unaware of where or who you are. You may also not know what the date or time is. If confused, you may also have difficulty paying attention, remembering, and making decisions. Some people also act aggressively when they are confused.  DIAGNOSIS  The medical evaluation of confusion may include:  Blood and urine tests.  X-rays.  Brain and nervous system tests.  Analyzing your brain waves (electroencephalogram or EEG).  Magnetic resonance imaging (MRI) of your head.  Computed tomography (CT) scan of your head.  Mental status tests in which your health care provider may ask many questions. Some of these questions may seem silly or strange,  but they are a very important test to help diagnose and treat confusion. TREATMENT  An admission to the hospital may not be needed, but a person with confusion should not be left alone. Stay with a family member or friend until the confusion clears. Avoid alcohol, pain relievers, or sedative drugs until you have fully recovered. Do not drive until directed by your health care provider. HOME CARE INSTRUCTIONS  What family and friends can do:  To find out if someone is confused, ask the person to state his or her name, age, and the date. If the person is unsure or answers incorrectly, he or she is confused.  Always introduce yourself, no matter how well the person knows you.  Often remind the person of his or her location.  Place a calendar and clock near the confused person.  Help the person with his or her medicines. You may want to use a pill box, an alarm as a reminder, or give the person each dose as prescribed.  Talk about current events and plans for the day.  Try to keep the environment calm, quiet, and peaceful.  Make sure the person keeps follow-up visits with his or her health care provider. PREVENTION  Ways to prevent confusion:  Avoid alcohol.  Eat a balanced diet.  Get enough sleep.  Take medicine only as directed by your health care provider.  Do not become isolated. Spend time with other people and make plans for your days.  Keep careful watch on your blood sugar levels if you are diabetic. SEEK IMMEDIATE MEDICAL CARE IF:   You develop severe headaches, repeated vomiting, seizures, blackouts, or   slurred speech.  There is increasing confusion, weakness, numbness, restlessness, or personality changes.  You develop a loss of balance, have marked dizziness, feel uncoordinated, or fall.  You have delusions, hallucinations, or develop severe anxiety.  Your family members think you need to be rechecked. Document Released: 09/27/2004 Document Revised: 01/04/2014  Document Reviewed: 09/25/2013 Exit[employer] Patient Information 2015 [employer]. This information is not intended to replace advice given to you by your health care provider. Make sure you discuss any questions you have with your health care provider.  
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
## 2014-09-13 ENCOUNTER — Other Ambulatory Visit: Payer: Self-pay

## 2014-09-13 MED ORDER — HYDROCODONE-ACETAMINOPHEN 7.5-325 MG PO TABS: 1.0000 | ORAL_TABLET | Freq: Four times a day (QID) | ORAL | Status: DC | PRN

## 2014-09-13 NOTE — Telephone Encounter (Signed)
RX printed and placed at the front for pick-up

## 2014-10-14 ENCOUNTER — Other Ambulatory Visit: Payer: Self-pay | Admitting: *Deleted

## 2014-10-14 MED ORDER — HYDROCODONE-ACETAMINOPHEN 7.5-325 MG PO TABS: 1.0000 | ORAL_TABLET | Freq: Four times a day (QID) | ORAL | Status: DC | PRN

## 2014-10-14 NOTE — Telephone Encounter (Signed)
Patient Requested and will pick up 

## 2014-10-20 ENCOUNTER — Telehealth: Payer: Self-pay

## 2014-10-20 NOTE — Telephone Encounter (Signed)
Patient called indicating he has a small skin tear on his left knee x 1 week. Patient injured knee while on a ladder. Patient used neosporin with no improvement. Patient questions if he needs an antibiotic. I informed patient he will need an appointment to have area examined to see if there is a need for antibiotics. Patient states this is a long drive for him  I advised patient he can use OTC creams/ointments or solutions (Witch Hazel, saline, hydrogen peroxide), otherwise appointment needed to evaluate area of concern   Patient is up to date on TDaP vaccine.  Patient will ask his pharmacist which OTC cream is best, clean area with witch hazel as needed and call if symptoms progress (warmth to the touch, fever, or drainage) or persist.

## 2014-10-22 ENCOUNTER — Telehealth: Payer: Self-pay

## 2014-10-22 NOTE — Telephone Encounter (Signed)
Patient left message on voice mail, still has infection on knee. Called patient, got voice mail, he needs appt, we can't treat it without seeing it. If it is red, has drainage he should   go to Urgent Care, we have an appt Monday with NP. Call back for appt.

## 2014-11-10 ENCOUNTER — Ambulatory Visit (INDEPENDENT_AMBULATORY_CARE_PROVIDER_SITE_OTHER): Payer: Medicare Other | Admitting: Internal Medicine

## 2014-11-10 ENCOUNTER — Encounter: Payer: Self-pay | Admitting: Internal Medicine

## 2014-11-10 VITALS — BP 122/80 | HR 69 | Temp 98.8°F | Resp 16 | Ht 69.0 in | Wt 190.0 lb

## 2014-11-10 DIAGNOSIS — L03116 Cellulitis of left lower limb: Secondary | ICD-10-CM | POA: Diagnosis not present

## 2014-11-10 DIAGNOSIS — L0293 Carbuncle, unspecified: Secondary | ICD-10-CM

## 2014-11-10 MED ORDER — DOXYCYCLINE HYCLATE 100 MG PO TABS: 100.0000 mg | ORAL_TABLET | Freq: Two times a day (BID) | ORAL | Status: DC

## 2014-11-10 NOTE — Progress Notes (Signed)
Patient ID: Logan Hayes, male   DOB: [birth year], 74 y.o.   MRN: [id number]    Facility  [facility]    Place of Service:   OFFICE   Allergies  Allergen Reactions  . Mirtazapine Other (See Comments)    Restless, weak    Chief Complaint  Patient presents with  . Acute Visit    Boils on left leg and knee x 2 wks, area is painful    HPI:  74 yo male seen today for boils on legs x 3 weeks. No f/c. No d/c. No insect bites. He tried OTC preps and hot compresses without relief. Pain with walking.    Past Medical History  Diagnosis Date  . Anxiety 02/14/1999  . Arthritis   . GERD (gastroesophageal reflux disease) 09/14/1998  . Glaucoma 02/13/2006  . Hyperlipidemia 04/04/2005  . Chronic joint pain   . IBS (irritable bowel syndrome)   . Erosive esophagitis   . Hiatal hernia   . Internal hemorrhoid   . Adenomatous polyp of colon 04/2004  . [name], shoulder region 12/05/2001  . Tension headache 02/13/1994  . Depressive disorder, not elsewhere classified 02/14/1972  . Unspecified tinnitus Oct 11, 1942  . Unspecified vitamin D deficiency   . Hypertrophy of prostate with urinary obstruction and other lower urinary tract symptoms (LUTS)   . Impotence of organic origin   . Insomnia, unspecified   . Attention or concentration deficit   . Tinnitus 1980   History   Social History  . Marital Status: Divorced    Spouse Name: N/A  . Number of Children: 1  . Years of Education: N/A   Occupational History  . retired    Social History Main Topics  . Smoking status: Former Smoker    Quit date: 08/10/1979  . Smokeless tobacco: Never Used  . Alcohol Use: No  . Drug Use: No  . Sexual Activity: Not on file   Other Topics Concern  . None   Social History Narrative    Medications: Patient's Medications  New Prescriptions   No medications on file  Previous Medications   ALPRAZOLAM (XANAX) 1 MG TABLET    One up to 4 times daily to help anxiety   ASPIRIN 81 MG TABLET     Take 81 mg by mouth daily. Take 1 tablet daily to prevent heart attack and stroke.   CRESTOR 10 MG TABLET    TAKE 1 TABLET EVERY DAY TO LOWER CHOLESTEROL   HYDROCODONE-ACETAMINOPHEN (NORCO) 7.5-325 MG PER TABLET    Take 1 tablet by mouth every 6 (six) hours as needed.   NALOXEGOL OXALATE (MOVANTIK) 25 MG TABS    Take 25 mg by mouth daily at 8 pm. One daily to help constipation   VILAZODONE HCL (VIIBRYD) 40 MG TABS    One daily to help depression  Modified Medications   No medications on file  Discontinued Medications   CRESTOR 10 MG TABLET    TAKE 1 TABLET EVERY DAY TO LOWER CHOLESTEROL     Review of Systems  Constitutional: Negative for fever and chills.  Musculoskeletal: Positive for myalgias and gait problem. Negative for arthralgias.  Skin: Positive for rash.  Neurological: Negative for tremors, weakness and numbness.  Psychiatric/Behavioral: Negative for self-injury.    Filed Vitals:   11/10/14 1535  BP: 122/80  Pulse: 69  Temp: 98.8 F (37.1 C)  TempSrc: Oral  Resp: 16  Height: $Remove'5\' 9"'FDeTeqw$  (1.753 m)  Weight: 190 lb (86.183 kg)  SpO2: 96%  Body mass index is 28.05 kg/(m^2).  Physical Exam  Constitutional: He is oriented to person, place, and time. He appears well-developed and well-nourished. No distress.  Cardiovascular: Intact distal pulses.   Distal pulses palpable. No calf TTP. No palpable cords. No distal swelling  Neurological: He is alert and oriented to person, place, and time.  Skin:        Labs reviewed: Admission on 08/28/2014, Discharged on 08/28/2014  Component Date Value Ref Range Status  . WBC 08/28/2014 7.6  4.0 - 10.5 K/uL Final  . RBC 08/28/2014 4.64  4.22 - 5.81 MIL/uL Final  . Hemoglobin 08/28/2014 14.0  13.0 - 17.0 g/dL Final  . HCT 08/28/2014 42.9  39.0 - 52.0 % Final  . MCV 08/28/2014 92.5  78.0 - 100.0 fL Final  . MCH 08/28/2014 30.2  26.0 - 34.0 pg Final  . MCHC 08/28/2014 32.6  30.0 - 36.0 g/dL Final  . RDW 08/28/2014 14.5  11.5 - 15.5  % Final  . Platelets 08/28/2014 227  150 - 400 K/uL Final  . Neutrophils Relative % 08/28/2014 71  43 - 77 % Final  . Neutro Abs 08/28/2014 5.3  1.7 - 7.7 K/uL Final  . Lymphocytes Relative 08/28/2014 14  12 - 46 % Final  . Lymphs Abs 08/28/2014 1.1  0.7 - 4.0 K/uL Final  . Monocytes Relative 08/28/2014 15* 3 - 12 % Final  . Monocytes Absolute 08/28/2014 1.2* 0.1 - 1.0 K/uL Final  . Eosinophils Relative 08/28/2014 0  0 - 5 % Final  . Eosinophils Absolute 08/28/2014 0.0  0.0 - 0.7 K/uL Final  . Basophils Relative 08/28/2014 0  0 - 1 % Final  . Basophils Absolute 08/28/2014 0.0  0.0 - 0.1 K/uL Final  . Sodium 08/28/2014 137  135 - 145 mmol/L Final   Please note change in reference range.  . Potassium 08/28/2014 3.6  3.5 - 5.1 mmol/L Final   Please note change in reference range.  . Chloride 08/28/2014 105  96 - 112 mEq/L Final  . CO2 08/28/2014 23  19 - 32 mmol/L Final  . Glucose, Bld 08/28/2014 98  70 - 99 mg/dL Final  . BUN 08/28/2014 17  6 - 23 mg/dL Final  . Creatinine, Ser 08/28/2014 1.33  0.50 - 1.35 mg/dL Final  . Calcium 08/28/2014 9.0  8.4 - 10.5 mg/dL Final  . Total Protein 08/28/2014 7.1  6.0 - 8.3 g/dL Final  . Albumin 08/28/2014 4.3  3.5 - 5.2 g/dL Final  . AST 08/28/2014 31  0 - 37 U/L Final  . ALT 08/28/2014 19  0 - 53 U/L Final  . Alkaline Phosphatase 08/28/2014 49  39 - 117 U/L Final  . Total Bilirubin 08/28/2014 0.7  0.3 - 1.2 mg/dL Final  . GFR calc non Af Amer 08/28/2014 52* >90 mL/min Final  . GFR calc Af Amer 08/28/2014 60* >90 mL/min Final   Comment: (NOTE) The eGFR has been calculated using the CKD EPI equation. This calculation has not been validated in all clinical situations. eGFR's persistently <90 mL/min signify possible Chronic Kidney Disease.   . Anion gap 08/28/2014 9  5 - 15 Final  . Color, Urine 08/28/2014 YELLOW  YELLOW Final  . APPearance 08/28/2014 CLEAR  CLEAR Final  . Specific Gravity, Urine 08/28/2014 1.021  1.005 - 1.030 Final  . pH  08/28/2014 7.5  5.0 - 8.0 Final  . Glucose, UA 08/28/2014 NEGATIVE  NEGATIVE mg/dL Final  . Hgb urine dipstick 08/28/2014 NEGATIVE  NEGATIVE Final  . Bilirubin Urine 08/28/2014 NEGATIVE  NEGATIVE Final  . Ketones, ur 08/28/2014 40* NEGATIVE mg/dL Final  . Protein, ur 08/28/2014 NEGATIVE  NEGATIVE mg/dL Final  . Urobilinogen, UA 08/28/2014 0.2  0.0 - 1.0 mg/dL Final  . Nitrite 08/28/2014 NEGATIVE  NEGATIVE Final  . Leukocytes, UA 08/28/2014 NEGATIVE  NEGATIVE Final   MICROSCOPIC NOT DONE ON URINES WITH NEGATIVE PROTEIN, BLOOD, LEUKOCYTES, NITRITE, OR GLUCOSE <1000 mg/dL.  Marland Kitchen Alcohol, Ethyl (B) 08/28/2014 <5  0 - 9 mg/dL Final   Comment:        LOWEST DETECTABLE LIMIT FOR SERUM ALCOHOL IS 11 mg/dL FOR MEDICAL PURPOSES ONLY   . Salicylate Lvl 67/34/1937 <4.0  2.8 - 20.0 mg/dL Final  . Acetaminophen (Tylenol), Serum 08/28/2014 <10.0* 10 - 30 ug/mL Final   Comment:        THERAPEUTIC CONCENTRATIONS VARY SIGNIFICANTLY. A RANGE OF 10-30 ug/mL MAY BE AN EFFECTIVE CONCENTRATION FOR MANY PATIENTS. HOWEVER, SOME ARE BEST TREATED AT CONCENTRATIONS OUTSIDE THIS RANGE. ACETAMINOPHEN CONCENTRATIONS >150 ug/mL AT 4 HOURS AFTER INGESTION AND >50 ug/mL AT 12 HOURS AFTER INGESTION ARE OFTEN ASSOCIATED WITH TOXIC REACTIONS.   Marland Kitchen Opiates 08/28/2014 POSITIVE* NONE DETECTED Final  . Cocaine 08/28/2014 NONE DETECTED  NONE DETECTED Final  . Benzodiazepines 08/28/2014 POSITIVE* NONE DETECTED Final  . Amphetamines 08/28/2014 NONE DETECTED  NONE DETECTED Final  . Tetrahydrocannabinol 08/28/2014 NONE DETECTED  NONE DETECTED Final  . Barbiturates 08/28/2014 NONE DETECTED  NONE DETECTED Final   Comment:        DRUG SCREEN FOR MEDICAL PURPOSES ONLY.  IF CONFIRMATION IS NEEDED FOR ANY PURPOSE, NOTIFY LAB WITHIN 5 DAYS.        LOWEST DETECTABLE LIMITS FOR URINE DRUG SCREEN Drug Class       Cutoff (ng/mL) Amphetamine      1000 Barbiturate      200 Benzodiazepine   902 Tricyclics       409 Opiates           300 Cocaine          300 THC              50      Assessment/Plan    ICD-9-CM ICD-10-CM   1. Carbuncles - new 680.9 L02.93 doxycycline (VIBRA-TABS) 100 MG tablet  2. Cellulitis of left lower extremity 682.6 L03.116    --Wash wounds in warm water using dial antibacterial soap.  --Keep covered with telfa nonstick pad until healed.  --Take all of the antibiotic as prescribed. Take with food. Avoid excessive sunlight  --Keep leg elevated when seated  --Follow up as scheduled. Call office if infection does not improve in next 3-4 days   [facility] S. Perlie Gold  [facility] [address] [place], [place] [address] [phone] Office (Wednesdays and Fridays 8 AM - 5 PM) [phone] Cell (Monday-Friday 8 AM - 5 PM)

## 2014-11-10 NOTE — Patient Instructions (Addendum)
Wash wounds in warm water using dial antibacterial soap. Keep covered with telfa nonstick pad until healed.  Take all of the antibiotic as prescribed. Take with food.  Follow up as scheduled. Call office if infection does not improve in next 3-4 days  Cellulitis Cellulitis is an infection of the skin and the tissue beneath it. The infected area is usually red and tender. Cellulitis occurs most often in the arms and lower legs.  CAUSES  Cellulitis is caused by bacteria that enter the skin through cracks or cuts in the skin. The most common types of bacteria that cause cellulitis are staphylococci and streptococci. SIGNS AND SYMPTOMS   Redness and warmth.  Swelling.  Tenderness or pain.  Fever. DIAGNOSIS  Your health care provider can usually determine what is wrong based on a physical exam. Blood tests may also be done. TREATMENT  Treatment usually involves taking an antibiotic medicine. HOME CARE INSTRUCTIONS   Take your antibiotic medicine as directed by your health care provider. Finish the antibiotic even if you start to feel better.  Keep the infected arm or leg elevated to reduce swelling.  Apply a warm cloth to the affected area up to 4 times per day to relieve pain.  Take medicines only as directed by your health care provider.  Keep all follow-up visits as directed by your health care provider. SEEK MEDICAL CARE IF:   You notice red streaks coming from the infected area.  Your red area gets larger or turns dark in color.  Your bone or joint underneath the infected area becomes painful after the skin has healed.  Your infection returns in the same area or another area.  You notice a swollen bump in the infected area.  You develop new symptoms.  You have a fever. SEEK IMMEDIATE MEDICAL CARE IF:   You feel very sleepy.  You develop vomiting or diarrhea.  You have a general ill feeling (malaise) with muscle aches and pains. MAKE SURE YOU:   Understand  these instructions.  Will watch your condition.  Will get help right away if you are not doing well or get worse. Document Released: 05/30/2005 Document Revised: 01/04/2014 Document Reviewed: 11/05/2011 [facility] Patient Information 2015 [place], [place]. This information is not intended to replace advice given to you by your health care provider. Make sure you discuss any questions you have with your health care provider.

## 2014-11-17 ENCOUNTER — Encounter: Payer: Self-pay | Admitting: Internal Medicine

## 2014-11-17 ENCOUNTER — Other Ambulatory Visit: Payer: Self-pay

## 2014-11-17 ENCOUNTER — Ambulatory Visit (INDEPENDENT_AMBULATORY_CARE_PROVIDER_SITE_OTHER): Payer: Medicare Other | Admitting: Internal Medicine

## 2014-11-17 ENCOUNTER — Telehealth: Payer: Self-pay

## 2014-11-17 VITALS — BP 110/72 | HR 75 | Temp 99.3°F | Resp 20 | Ht 69.0 in | Wt 184.0 lb

## 2014-11-17 DIAGNOSIS — L0293 Carbuncle, unspecified: Secondary | ICD-10-CM

## 2014-11-17 DIAGNOSIS — L03116 Cellulitis of left lower limb: Secondary | ICD-10-CM

## 2014-11-17 DIAGNOSIS — F32A Depression, unspecified: Secondary | ICD-10-CM

## 2014-11-17 DIAGNOSIS — R413 Other amnesia: Secondary | ICD-10-CM

## 2014-11-17 DIAGNOSIS — F329 Major depressive disorder, single episode, unspecified: Secondary | ICD-10-CM

## 2014-11-17 MED ORDER — HYDROCODONE-ACETAMINOPHEN 7.5-325 MG PO TABS: 1.0000 | ORAL_TABLET | Freq: Four times a day (QID) | ORAL | Status: DC | PRN

## 2014-11-17 MED ORDER — FLUVOXAMINE MALEATE 25 MG PO TABS: 25.0000 mg | ORAL_TABLET | Freq: Every day | ORAL | Status: DC

## 2014-11-17 MED ORDER — DONEPEZIL HCL 10 MG PO TABS: ORAL_TABLET | ORAL | Status: DC

## 2014-11-17 MED ORDER — DOXYCYCLINE HYCLATE 100 MG PO TABS: 100.0000 mg | ORAL_TABLET | Freq: Two times a day (BID) | ORAL | Status: DC

## 2014-11-17 NOTE — Patient Instructions (Addendum)
STOP Viibryd for depression.  START Fluvoxamine for depression and donepizil for memory. Take at 10 PM every night  Keep appointment with Dr Nyoka Cowden next month as scheduled.   Call if questions or concerns.

## 2014-11-17 NOTE — Progress Notes (Signed)
Clock test passed

## 2014-11-17 NOTE — Progress Notes (Signed)
Patient ID: Logan Hayes, male   DOB: [birth year], 74 y.o.   MRN: [id number]    Facility  [facility]    Place of Service:   OFFICE   Allergies  Allergen Reactions  . Mirtazapine Other (See Comments)    Restless, weak    Chief Complaint  Patient presents with  . Acute Visit    left carbuncle; LLE cellulitis    HPI:  74 yo male seen today for LLE cellulitis and carbuncles that do not appear to be healing. He has 1 day left of Doxy. He noticed some bleeding from posterior leg carbuncle. No purulent d/c. No f/c. Minimum pain.  He reports c/a memory loss and states that it is getting worse. He is not forgetting names of family/friends. He does misplace items and repeats himself. He has increased depression along with anhedonia. No SI/HI. He is currently on viibryd but states it is not working. His friends and family are beginning to notice his memory decline and it is upsetting him.  Past Medical History  Diagnosis Date  . Anxiety 02/14/1999  . Arthritis   . GERD (gastroesophageal reflux disease) 09/14/1998  . Glaucoma 02/13/2006  . Hyperlipidemia 04/04/2005  . Chronic joint pain   . IBS (irritable bowel syndrome)   . Erosive esophagitis   . Hiatal hernia   . Internal hemorrhoid   . Adenomatous polyp of colon 04/2004  . [name], shoulder region 12/05/2001  . Tension headache 02/13/1994  . Depressive disorder, not elsewhere classified 02/14/1972  . Unspecified tinnitus 1943/04/02  . Unspecified vitamin D deficiency   . Hypertrophy of prostate with urinary obstruction and other lower urinary tract symptoms (LUTS)   . Impotence of organic origin   . Insomnia, unspecified   . Attention or concentration deficit   . Tinnitus 1980   Past Surgical History  Procedure Laterality Date  . Ankle fracture surgery Left 2002    Dr. Gladstone Lighter  . Rotator cuff repair      left  . Vasectomy  1970  . Kidney stone surgery  1973    Medications: Patient's Medications  New  Prescriptions   No medications on file  Previous Medications   ALPRAZOLAM (XANAX) 1 MG TABLET    One up to 4 times daily to help anxiety   ASPIRIN 81 MG TABLET    Take 81 mg by mouth daily. Take 1 tablet daily to prevent heart attack and stroke.   CRESTOR 10 MG TABLET    TAKE 1 TABLET EVERY DAY TO LOWER CHOLESTEROL   DOXYCYCLINE (VIBRA-TABS) 100 MG TABLET    Take 1 tablet (100 mg total) by mouth 2 (two) times daily.   HYDROCODONE-ACETAMINOPHEN (NORCO) 7.5-325 MG PER TABLET    Take 1 tablet by mouth every 6 (six) hours as needed.   NALOXEGOL OXALATE (MOVANTIK) 25 MG TABS    Take 25 mg by mouth daily at 8 pm. One daily to help constipation   VILAZODONE HCL (VIIBRYD) 40 MG TABS    One daily to help depression  Modified Medications   No medications on file  Discontinued Medications   No medications on file     Review of Systems  Skin: Positive for wound.  Psychiatric/Behavioral: Positive for confusion, sleep disturbance, dysphoric mood and decreased concentration. Negative for suicidal ideas, hallucinations, behavioral problems and self-injury. The patient is not nervous/anxious.     Filed Vitals:   11/17/14 1612  BP: 110/72  Pulse: 75  Temp: 99.3 F (37.4 C)  TempSrc: Oral  Resp: 20  Height: $Remove'5\' 9"'KzjvOGn$  (1.753 m)  Weight: 184 lb (83.462 kg)  SpO2: 99%   Body mass index is 27.16 kg/(m^2).  Physical Exam  Constitutional: He appears well-developed and well-nourished. No distress.  Looks well in NAD.  Neurological: He is alert.  Skin: There is erythema.     Improving carbuncles  Psychiatric: His speech is normal and behavior is normal. Judgment and thought content normal. He exhibits a depressed mood. He exhibits abnormal recent memory.     Labs reviewed: Admission on 08/28/2014, Discharged on 08/28/2014  Component Date Value Ref Range Status  . WBC 08/28/2014 7.6  4.0 - 10.5 K/uL Final  . RBC 08/28/2014 4.64  4.22 - 5.81 MIL/uL Final  . Hemoglobin 08/28/2014 14.0  13.0 - 17.0  g/dL Final  . HCT 08/28/2014 42.9  39.0 - 52.0 % Final  . MCV 08/28/2014 92.5  78.0 - 100.0 fL Final  . MCH 08/28/2014 30.2  26.0 - 34.0 pg Final  . MCHC 08/28/2014 32.6  30.0 - 36.0 g/dL Final  . RDW 08/28/2014 14.5  11.5 - 15.5 % Final  . Platelets 08/28/2014 227  150 - 400 K/uL Final  . Neutrophils Relative % 08/28/2014 71  43 - 77 % Final  . Neutro Abs 08/28/2014 5.3  1.7 - 7.7 K/uL Final  . Lymphocytes Relative 08/28/2014 14  12 - 46 % Final  . Lymphs Abs 08/28/2014 1.1  0.7 - 4.0 K/uL Final  . Monocytes Relative 08/28/2014 15* 3 - 12 % Final  . Monocytes Absolute 08/28/2014 1.2* 0.1 - 1.0 K/uL Final  . Eosinophils Relative 08/28/2014 0  0 - 5 % Final  . Eosinophils Absolute 08/28/2014 0.0  0.0 - 0.7 K/uL Final  . Basophils Relative 08/28/2014 0  0 - 1 % Final  . Basophils Absolute 08/28/2014 0.0  0.0 - 0.1 K/uL Final  . Sodium 08/28/2014 137  135 - 145 mmol/L Final   Please note change in reference range.  . Potassium 08/28/2014 3.6  3.5 - 5.1 mmol/L Final   Please note change in reference range.  . Chloride 08/28/2014 105  96 - 112 mEq/L Final  . CO2 08/28/2014 23  19 - 32 mmol/L Final  . Glucose, Bld 08/28/2014 98  70 - 99 mg/dL Final  . BUN 08/28/2014 17  6 - 23 mg/dL Final  . Creatinine, Ser 08/28/2014 1.33  0.50 - 1.35 mg/dL Final  . Calcium 08/28/2014 9.0  8.4 - 10.5 mg/dL Final  . Total Protein 08/28/2014 7.1  6.0 - 8.3 g/dL Final  . Albumin 08/28/2014 4.3  3.5 - 5.2 g/dL Final  . AST 08/28/2014 31  0 - 37 U/L Final  . ALT 08/28/2014 19  0 - 53 U/L Final  . Alkaline Phosphatase 08/28/2014 49  39 - 117 U/L Final  . Total Bilirubin 08/28/2014 0.7  0.3 - 1.2 mg/dL Final  . GFR calc non Af Amer 08/28/2014 52* >90 mL/min Final  . GFR calc Af Amer 08/28/2014 60* >90 mL/min Final   Comment: (NOTE) The eGFR has been calculated using the CKD EPI equation. This calculation has not been validated in all clinical situations. eGFR's persistently <90 mL/min signify possible  Chronic Kidney Disease.   . Anion gap 08/28/2014 9  5 - 15 Final  . Color, Urine 08/28/2014 YELLOW  YELLOW Final  . APPearance 08/28/2014 CLEAR  CLEAR Final  . Specific Gravity, Urine 08/28/2014 1.021  1.005 - 1.030 Final  . pH  08/28/2014 7.5  5.0 - 8.0 Final  . Glucose, UA 08/28/2014 NEGATIVE  NEGATIVE mg/dL Final  . Hgb urine dipstick 08/28/2014 NEGATIVE  NEGATIVE Final  . Bilirubin Urine 08/28/2014 NEGATIVE  NEGATIVE Final  . Ketones, ur 08/28/2014 40* NEGATIVE mg/dL Final  . Protein, ur 08/28/2014 NEGATIVE  NEGATIVE mg/dL Final  . Urobilinogen, UA 08/28/2014 0.2  0.0 - 1.0 mg/dL Final  . Nitrite 08/28/2014 NEGATIVE  NEGATIVE Final  . Leukocytes, UA 08/28/2014 NEGATIVE  NEGATIVE Final   MICROSCOPIC NOT DONE ON URINES WITH NEGATIVE PROTEIN, BLOOD, LEUKOCYTES, NITRITE, OR GLUCOSE <1000 mg/dL.  Marland Kitchen Alcohol, Ethyl (B) 08/28/2014 <5  0 - 9 mg/dL Final   Comment:        LOWEST DETECTABLE LIMIT FOR SERUM ALCOHOL IS 11 mg/dL FOR MEDICAL PURPOSES ONLY   . Salicylate Lvl 40/98/1191 <4.0  2.8 - 20.0 mg/dL Final  . Acetaminophen (Tylenol), Serum 08/28/2014 <10.0* 10 - 30 ug/mL Final   Comment:        THERAPEUTIC CONCENTRATIONS VARY SIGNIFICANTLY. A RANGE OF 10-30 ug/mL MAY BE AN EFFECTIVE CONCENTRATION FOR MANY PATIENTS. HOWEVER, SOME ARE BEST TREATED AT CONCENTRATIONS OUTSIDE THIS RANGE. ACETAMINOPHEN CONCENTRATIONS >150 ug/mL AT 4 HOURS AFTER INGESTION AND >50 ug/mL AT 12 HOURS AFTER INGESTION ARE OFTEN ASSOCIATED WITH TOXIC REACTIONS.   Marland Kitchen Opiates 08/28/2014 POSITIVE* NONE DETECTED Final  . Cocaine 08/28/2014 NONE DETECTED  NONE DETECTED Final  . Benzodiazepines 08/28/2014 POSITIVE* NONE DETECTED Final  . Amphetamines 08/28/2014 NONE DETECTED  NONE DETECTED Final  . Tetrahydrocannabinol 08/28/2014 NONE DETECTED  NONE DETECTED Final  . Barbiturates 08/28/2014 NONE DETECTED  NONE DETECTED Final   Comment:        DRUG SCREEN FOR MEDICAL PURPOSES ONLY.  IF CONFIRMATION IS  NEEDED FOR ANY PURPOSE, NOTIFY LAB WITHIN 5 DAYS.        LOWEST DETECTABLE LIMITS FOR URINE DRUG SCREEN Drug Class       Cutoff (ng/mL) Amphetamine      1000 Barbiturate      200 Benzodiazepine   478 Tricyclics       295 Opiates          300 Cocaine          300 THC              50      Assessment/Plan   ICD-9-CM ICD-10-CM   1. Cellulitis of left lower extremity - improving on Doxy but no quite healed 682.6 L03.116   2. Carbuncles - resolving 680.9 L02.93   3. Depression - uncontrolled on Viibryd 311 F32.9   4. Memory changes - worse 780.93 R41.3     --check MMSE today - score 25/30  --continue Doxy BID x 7 additional days. Take with food  --will change viibrd to luvox for better control of depression. Did d/w Dr Nyoka Cowden and there is concern that he may have OCD but has declined psych eval in the past  --keep appt with Dr Nyoka Cowden next month.    Vella Colquitt S. Perlie Gold  [facility] [address] [place], [place] [address] [phone] Office (Wednesdays and Fridays 8 AM - 5 PM) [phone] Cell (Monday-Friday 8 AM - 5 PM)

## 2014-11-17 NOTE — Addendum Note (Signed)
Addended by: Gildardo Cranker on: 11/17/2014 05:34 PM   Modules accepted: Orders

## 2014-11-17 NOTE — Telephone Encounter (Signed)
He needs to be re evaluated.

## 2014-11-17 NOTE — Telephone Encounter (Signed)
Spoke with patient, scheduled appointment for today at 4:00 pm

## 2014-11-17 NOTE — Telephone Encounter (Signed)
Patient called indicating he was seen recently for 2 raised areas on his leg. The area closest to his ankle is not healing. Patient was told to call if no improvement.  Last OV 11/10/14   Please advise on plan of care

## 2014-12-07 ENCOUNTER — Ambulatory Visit (INDEPENDENT_AMBULATORY_CARE_PROVIDER_SITE_OTHER): Payer: Medicare Other | Admitting: Internal Medicine

## 2014-12-07 ENCOUNTER — Encounter: Payer: Self-pay | Admitting: Internal Medicine

## 2014-12-07 VITALS — BP 114/70 | HR 69 | Temp 98.6°F | Resp 20 | Ht 69.0 in | Wt 179.6 lb

## 2014-12-07 DIAGNOSIS — F42 Obsessive-compulsive disorder: Secondary | ICD-10-CM | POA: Diagnosis not present

## 2014-12-07 DIAGNOSIS — F429 Obsessive-compulsive disorder, unspecified: Secondary | ICD-10-CM | POA: Insufficient documentation

## 2014-12-07 DIAGNOSIS — H9313 Tinnitus, bilateral: Secondary | ICD-10-CM

## 2014-12-07 DIAGNOSIS — L03116 Cellulitis of left lower limb: Secondary | ICD-10-CM | POA: Diagnosis not present

## 2014-12-07 DIAGNOSIS — E669 Obesity, unspecified: Secondary | ICD-10-CM

## 2014-12-07 NOTE — Progress Notes (Signed)
Patient ID: Logan Hayes, male   DOB: 1940-12-14, 74 y.o.   MRN: [id number]    Facility  [facility]    Place of Service:   OFFICE   Allergies  Allergen Reactions  . Mirtazapine Other (See Comments)    Restless, weak    Chief Complaint  Patient presents with  . Medical Management of Chronic Issues    HPI:  OCD (obsessive compulsive disorder); he is now doing more to clean up his house.  Cellulitis of leg, left: improved  Obesity: losing weight  Tinnitus, bilateral: wants referral to another audiologist for the tinnitus  Constipation is better. Used mag citrate  Medications: Patient's Medications  New Prescriptions   No medications on file  Previous Medications   ALPRAZOLAM (XANAX) 1 MG TABLET    One up to 4 times daily to help anxiety   ASPIRIN 81 MG TABLET    Take 81 mg by mouth daily. Take 1 tablet daily to prevent heart attack and stroke.   CRESTOR 10 MG TABLET    TAKE 1 TABLET EVERY DAY TO LOWER CHOLESTEROL   DONEPEZIL (ARICEPT) 10 MG TABLET       FLUVOXAMINE (LUVOX) 25 MG TABLET       HYDROCODONE-ACETAMINOPHEN (NORCO) 7.5-325 MG PER TABLET    Take 1 tablet by mouth every 6 (six) hours as needed.   NALOXEGOL OXALATE (MOVANTIK) 25 MG TABS    Take 25 mg by mouth daily at 8 pm. One daily to help constipation  Modified Medications   No medications on file  Discontinued Medications   DONEPEZIL (ARICEPT) 10 MG TABLET    Take 0.5 tablet at 10 PM every night x 1 week then increase to 1 tablet every night thereafter   DOXYCYCLINE (VIBRA-TABS) 100 MG TABLET    Take 1 tablet (100 mg total) by mouth 2 (two) times daily.   FLUVOXAMINE (LUVOX) 25 MG TABLET    Take 1 tablet (25 mg total) by mouth at bedtime.     Review of Systems  Constitutional: Positive for activity change and fatigue. Negative for fever, chills and appetite change.  HENT: Negative for ear discharge and ear pain.        Severe tinnitus  Eyes: Negative.   Respiratory: Negative for apnea, cough, shortness  of breath and wheezing.   Cardiovascular: Negative for chest pain, palpitations and leg swelling.  Gastrointestinal: Positive for constipation. Negative for nausea, abdominal pain, diarrhea, abdominal distention and rectal pain.  Endocrine: Negative.   Genitourinary:       Slow, weak, stream  Musculoskeletal: Positive for arthralgias (right ankle and shoulders).  Allergic/Immunologic: Negative.   Neurological: Negative.   Hematological: Negative.   Psychiatric/Behavioral: Positive for sleep disturbance and decreased concentration. The patient is nervous/anxious.        Dyshporic mood with hx of depressioin    Filed Vitals:   12/07/14 1354  BP: 114/70  Pulse: 69  Temp: 98.6 F (37 C)  TempSrc: Oral  Resp: 20  Height: _0  (1.753 m)  Weight: 179 lb 9.6 oz (81.466 kg)  SpO2: 96%   Body mass index is 26.51 kg/(m^2).  Physical Exam  Constitutional: He is oriented to person, place, and time. He appears well-developed and well-nourished. No distress.  HENT:  Right Ear: External ear normal.  Left Ear: External ear normal.  Nose: Nose normal.  Mouth/Throat: Oropharynx is clear and moist. No oropharyngeal exudate.  Eyes: Conjunctivae and EOM are normal. Pupils are equal, round, and reactive to light.  Neck: No JVD present. No tracheal deviation present. No thyromegaly present.  Cardiovascular: Normal rate, regular rhythm, normal heart sounds and intact distal pulses.  Exam reveals no gallop and no friction rub.   No murmur heard. Pulmonary/Chest: No respiratory distress. He has no wheezes. He has no rales. He exhibits no tenderness.  Abdominal: He exhibits no distension and no mass. There is no tenderness.  No inguinal adenopathy  Genitourinary:  External skin tag and internal hemorrhoid  Musculoskeletal: Normal range of motion. He exhibits tenderness (shoulders and right ankle). He exhibits no edema.  Lymphadenopathy:    He has no cervical adenopathy.  Neurological: He is alert  and oriented to person, place, and time. He has normal reflexes. No cranial nerve deficit. Coordination normal.  12/06/10 MMSE 27/30. Passed clock drawing.  Skin: No rash noted. No erythema. No pallor.  Psychiatric: His behavior is normal. Thought content normal.     Labs reviewed: No visits with results within 3 Month(s) from this visit. Latest known visit with results is:  Admission on 08/28/2014, Discharged on 08/28/2014  Component Date Value Ref Range Status  . WBC 08/28/2014 7.6  4.0 - 10.5 K/uL Final  . RBC 08/28/2014 4.64  4.22 - 5.81 MIL/uL Final  . Hemoglobin 08/28/2014 14.0  13.0 - 17.0 g/dL Final  . HCT 08/28/2014 42.9  39.0 - 52.0 % Final  . MCV 08/28/2014 92.5  78.0 - 100.0 fL Final  . MCH 08/28/2014 30.2  26.0 - 34.0 pg Final  . MCHC 08/28/2014 32.6  30.0 - 36.0 g/dL Final  . RDW 08/28/2014 14.5  11.5 - 15.5 % Final  . Platelets 08/28/2014 227  150 - 400 K/uL Final  . Neutrophils Relative % 08/28/2014 71  43 - 77 % Final  . Neutro Abs 08/28/2014 5.3  1.7 - 7.7 K/uL Final  . Lymphocytes Relative 08/28/2014 14  12 - 46 % Final  . Lymphs Abs 08/28/2014 1.1  0.7 - 4.0 K/uL Final  . Monocytes Relative 08/28/2014 15* 3 - 12 % Final  . Monocytes Absolute 08/28/2014 1.2* 0.1 - 1.0 K/uL Final  . Eosinophils Relative 08/28/2014 0  0 - 5 % Final  . Eosinophils Absolute 08/28/2014 0.0  0.0 - 0.7 K/uL Final  . Basophils Relative 08/28/2014 0  0 - 1 % Final  . Basophils Absolute 08/28/2014 0.0  0.0 - 0.1 K/uL Final  . Sodium 08/28/2014 137  135 - 145 mmol/L Final   Please note change in reference range.  . Potassium 08/28/2014 3.6  3.5 - 5.1 mmol/L Final   Please note change in reference range.  . Chloride 08/28/2014 105  96 - 112 mEq/L Final  . CO2 08/28/2014 23  19 - 32 mmol/L Final  . Glucose, Bld 08/28/2014 98  70 - 99 mg/dL Final  . BUN 08/28/2014 17  6 - 23 mg/dL Final  . Creatinine, Ser 08/28/2014 1.33  0.50 - 1.35 mg/dL Final  . Calcium 08/28/2014 9.0  8.4 - 10.5 mg/dL  Final  . Total Protein 08/28/2014 7.1  6.0 - 8.3 g/dL Final  . Albumin 08/28/2014 4.3  3.5 - 5.2 g/dL Final  . AST 08/28/2014 31  0 - 37 U/L Final  . ALT 08/28/2014 19  0 - 53 U/L Final  . Alkaline Phosphatase 08/28/2014 49  39 - 117 U/L Final  . Total Bilirubin 08/28/2014 0.7  0.3 - 1.2 mg/dL Final  . GFR calc non Af Amer 08/28/2014 52* >90 mL/min Final  . GFR  calc Af Amer 08/28/2014 60* >90 mL/min Final   Comment: (NOTE) The eGFR has been calculated using the CKD EPI equation. This calculation has not been validated in all clinical situations. eGFR's persistently <90 mL/min signify possible Chronic Kidney Disease.   . Anion gap 08/28/2014 9  5 - 15 Final  . Color, Urine 08/28/2014 YELLOW  YELLOW Final  . APPearance 08/28/2014 CLEAR  CLEAR Final  . Specific Gravity, Urine 08/28/2014 1.021  1.005 - 1.030 Final  . pH 08/28/2014 7.5  5.0 - 8.0 Final  . Glucose, UA 08/28/2014 NEGATIVE  NEGATIVE mg/dL Final  . Hgb urine dipstick 08/28/2014 NEGATIVE  NEGATIVE Final  . Bilirubin Urine 08/28/2014 NEGATIVE  NEGATIVE Final  . Ketones, ur 08/28/2014 40* NEGATIVE mg/dL Final  . Protein, ur 08/28/2014 NEGATIVE  NEGATIVE mg/dL Final  . Urobilinogen, UA 08/28/2014 0.2  0.0 - 1.0 mg/dL Final  . Nitrite 08/28/2014 NEGATIVE  NEGATIVE Final  . Leukocytes, UA 08/28/2014 NEGATIVE  NEGATIVE Final   MICROSCOPIC NOT DONE ON URINES WITH NEGATIVE PROTEIN, BLOOD, LEUKOCYTES, NITRITE, OR GLUCOSE <1000 mg/dL.  Marland Kitchen Alcohol, Ethyl (B) 08/28/2014 <5  0 - 9 mg/dL Final   Comment:        LOWEST DETECTABLE LIMIT FOR SERUM ALCOHOL IS 11 mg/dL FOR MEDICAL PURPOSES ONLY   . Salicylate Lvl 16/06/9603 <4.0  2.8 - 20.0 mg/dL Final  . Acetaminophen (Tylenol), Serum 08/28/2014 <10.0* 10 - 30 ug/mL Final   Comment:        THERAPEUTIC CONCENTRATIONS VARY SIGNIFICANTLY. A RANGE OF 10-30 ug/mL MAY BE AN EFFECTIVE CONCENTRATION FOR MANY PATIENTS. HOWEVER, SOME ARE BEST TREATED AT CONCENTRATIONS OUTSIDE  THIS RANGE. ACETAMINOPHEN CONCENTRATIONS >150 ug/mL AT 4 HOURS AFTER INGESTION AND >50 ug/mL AT 12 HOURS AFTER INGESTION ARE OFTEN ASSOCIATED WITH TOXIC REACTIONS.   Marland Kitchen Opiates 08/28/2014 POSITIVE* NONE DETECTED Final  . Cocaine 08/28/2014 NONE DETECTED  NONE DETECTED Final  . Benzodiazepines 08/28/2014 POSITIVE* NONE DETECTED Final  . Amphetamines 08/28/2014 NONE DETECTED  NONE DETECTED Final  . Tetrahydrocannabinol 08/28/2014 NONE DETECTED  NONE DETECTED Final  . Barbiturates 08/28/2014 NONE DETECTED  NONE DETECTED Final   Comment:        DRUG SCREEN FOR MEDICAL PURPOSES ONLY.  IF CONFIRMATION IS NEEDED FOR ANY PURPOSE, NOTIFY LAB WITHIN 5 DAYS.        LOWEST DETECTABLE LIMITS FOR URINE DRUG SCREEN Drug Class       Cutoff (ng/mL) Amphetamine      1000 Barbiturate      200 Benzodiazepine   540 Tricyclics       981 Opiates          300 Cocaine          300 THC              50      Assessment/Plan  1. OCD (obsessive compulsive disorder) Did not  Tolerate fluvoxamine.  2. Cellulitis of leg, left improved  3. Obesity Losing weight  4. Tinnitus, bilateral Audiology referral

## 2014-12-20 ENCOUNTER — Other Ambulatory Visit: Payer: Self-pay | Admitting: *Deleted

## 2014-12-20 MED ORDER — HYDROCODONE-ACETAMINOPHEN 7.5-325 MG PO TABS: 1.0000 | ORAL_TABLET | Freq: Four times a day (QID) | ORAL | Status: DC | PRN

## 2014-12-20 NOTE — Telephone Encounter (Signed)
Patient Requested and will pick up 

## 2014-12-28 ENCOUNTER — Telehealth: Payer: Self-pay | Admitting: *Deleted

## 2014-12-28 NOTE — Telephone Encounter (Signed)
Received Prior Authorization for Alprazolam from [facility] # [id number] Member ID# [id number] Filled out paperwork and left for Dr. Nyoka Cowden to review and sign.

## 2014-12-29 NOTE — Telephone Encounter (Signed)
Message received on triage voicemail, alprazolam was approved 12/29/14-12/29/15. Patient was notified. We will also received letter with this approval notice.  I called [facility] on [address] and informed Logan Hayes that medication was approved

## 2015-01-05 ENCOUNTER — Ambulatory Visit: Payer: Medicare Other | Admitting: Internal Medicine

## 2015-01-20 ENCOUNTER — Other Ambulatory Visit: Payer: Self-pay | Admitting: *Deleted

## 2015-01-20 MED ORDER — HYDROCODONE-ACETAMINOPHEN 7.5-325 MG PO TABS: 1.0000 | ORAL_TABLET | Freq: Four times a day (QID) | ORAL | Status: DC | PRN

## 2015-01-20 NOTE — Telephone Encounter (Signed)
Patient Requested and will pick up 

## 2015-02-21 ENCOUNTER — Other Ambulatory Visit: Payer: Self-pay | Admitting: Internal Medicine

## 2015-02-22 ENCOUNTER — Ambulatory Visit: Payer: Medicare Other | Admitting: Internal Medicine

## 2015-02-23 ENCOUNTER — Other Ambulatory Visit: Payer: Self-pay | Admitting: Internal Medicine

## 2015-02-24 ENCOUNTER — Other Ambulatory Visit: Payer: Self-pay | Admitting: *Deleted

## 2015-02-24 MED ORDER — HYDROCODONE-ACETAMINOPHEN 7.5-325 MG PO TABS: 1.0000 | ORAL_TABLET | Freq: Four times a day (QID) | ORAL | Status: DC | PRN

## 2015-02-24 NOTE — Telephone Encounter (Signed)
Patient requested and will pick up 

## 2015-03-16 ENCOUNTER — Encounter: Payer: Self-pay | Admitting: Internal Medicine

## 2015-03-16 ENCOUNTER — Ambulatory Visit (INDEPENDENT_AMBULATORY_CARE_PROVIDER_SITE_OTHER): Payer: Medicare Other | Admitting: Internal Medicine

## 2015-03-16 VITALS — BP 138/98 | HR 72 | Temp 98.6°F | Resp 20 | Ht 69.0 in | Wt 187.8 lb

## 2015-03-16 DIAGNOSIS — R413 Other amnesia: Secondary | ICD-10-CM

## 2015-03-16 NOTE — Progress Notes (Signed)
Patient ID: Logan Hayes, male   DOB: 1941-06-29, 74 y.o.   MRN: [id number]    Facility  [facility]    Place of Service:   Office    Allergies  Allergen Reactions  . Donepezil Anaphylaxis    Made memory worse  . Luvox [Fluvoxamine]     Felt worse  . Mirtazapine Other (See Comments)    Restless, weak    Chief Complaint  Patient presents with  . Acute Visit     Concerns of his memory getting worse    HPI:   Memory changes Patient is aware of changes in his memory. He is emotional today about what is likely to happen to him. MMSE score was 23/30. He was able to complete the clock drawing and able to remember 2 out of 3 items. He lost points on orientation to time, date, season, etc. He denies headaches, focal weakness, or changes in his chronic tinnitus. He is now frequently unable to find items in his house. 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  CT brain in 2015 was unremarkable.   Medications: Patient's Medications  New Prescriptions   No medications on file  Previous Medications   ALPRAZOLAM (XANAX) 1 MG TABLET    TAKE 1 TABLET BY MOUTH UP TO 4 TIMES A DAY TO HELP ANXIETY   ASPIRIN 81 MG TABLET    Take 81 mg by mouth daily. Take 1 tablet daily to prevent heart attack and stroke.   CRESTOR 10 MG TABLET    TAKE 1 TABLET BY MOUTH ONCE A DAY TO LOWER CHOLESTEROL   HYDROCODONE-ACETAMINOPHEN (NORCO) 7.5-325 MG PER TABLET    Take 1 tablet by mouth every 6 (six) hours as needed.  Modified Medications   No medications on file  Discontinued Medications   CRESTOR 10 MG TABLET    TAKE 1 TABLET EVERY DAY TO LOWER CHOLESTEROL     Review of Systems  Constitutional: Positive for activity change and fatigue. Negative for fever, chills and appetite change.  HENT: Negative for ear discharge and ear pain.        Severe tinnitus  Eyes: Negative.   Respiratory: Negative for apnea, cough, shortness of breath and wheezing.     Cardiovascular: Negative for chest pain, palpitations and leg swelling.  Gastrointestinal: Positive for constipation. Negative for nausea, abdominal pain, diarrhea, abdominal distention and rectal pain.  Endocrine: Negative.   Genitourinary:       Slow, weak, stream  Musculoskeletal: Positive for arthralgias (right ankle and shoulders).  Allergic/Immunologic: Negative.   Neurological:       Memory loss  Hematological: Negative.   Psychiatric/Behavioral: Positive for confusion, sleep disturbance and decreased concentration. The patient is nervous/anxious.        Dyshporic mood with hx of depressioin    Filed Vitals:   03/16/15 1430  BP: 138/98  Pulse: 72  Temp: 98.6 F (37 C)  TempSrc: Oral  Resp: 20  Height: _0  (1.753 m)  Weight: 187 lb 12.8 oz (85.186 kg)  SpO2: 94%   Body mass index is 27.72 kg/(m^2).  Physical Exam  Constitutional: He is oriented to person, place, and time. He appears well-developed and well-nourished. No distress.  HENT:  Right Ear: External ear normal.  Left Ear: External ear normal.  Nose: Nose normal.  Mouth/Throat: Oropharynx is clear and moist. No oropharyngeal exudate.  Eyes: Conjunctivae and EOM are normal. Pupils are equal, round, and reactive to  light.  Neck: No JVD present. No tracheal deviation present. No thyromegaly present.  Cardiovascular: Normal rate, regular rhythm, normal heart sounds and intact distal pulses.  Exam reveals no gallop and no friction rub.   No murmur heard. Pulmonary/Chest: No respiratory distress. He has no wheezes. He has no rales. He exhibits no tenderness.  Abdominal: He exhibits no distension and no mass. There is no tenderness.  No inguinal adenopathy  Genitourinary:  External skin tag and internal hemorrhoid  Musculoskeletal: Normal range of motion. He exhibits tenderness (shoulders and right ankle). He exhibits no edema.  Lymphadenopathy:    He has no cervical adenopathy.  Neurological: He is alert and  oriented to person, place, and time. He has normal reflexes. No cranial nerve deficit. Coordination normal.  12/06/10 MMSE 27/30. Passed clock drawing. 03/16/15 MMSE 23/30. Passed clock drawing.  Skin: No rash noted. No erythema. No pallor.  Psychiatric: His behavior is normal. Thought content normal.     Labs reviewed: No visits with results within 3 Month(s) from this visit. Latest known visit with results is:  Admission on 08/28/2014, Discharged on 08/28/2014  Component Date Value Ref Range Status  . WBC 08/28/2014 7.6  4.0 - 10.5 K/uL Final  . RBC 08/28/2014 4.64  4.22 - 5.81 MIL/uL Final  . Hemoglobin 08/28/2014 14.0  13.0 - 17.0 g/dL Final  . HCT 08/28/2014 42.9  39.0 - 52.0 % Final  . MCV 08/28/2014 92.5  78.0 - 100.0 fL Final  . MCH 08/28/2014 30.2  26.0 - 34.0 pg Final  . MCHC 08/28/2014 32.6  30.0 - 36.0 g/dL Final  . RDW 08/28/2014 14.5  11.5 - 15.5 % Final  . Platelets 08/28/2014 227  150 - 400 K/uL Final  . Neutrophils Relative % 08/28/2014 71  43 - 77 % Final  . Neutro Abs 08/28/2014 5.3  1.7 - 7.7 K/uL Final  . Lymphocytes Relative 08/28/2014 14  12 - 46 % Final  . Lymphs Abs 08/28/2014 1.1  0.7 - 4.0 K/uL Final  . Monocytes Relative 08/28/2014 15* 3 - 12 % Final  . Monocytes Absolute 08/28/2014 1.2* 0.1 - 1.0 K/uL Final  . Eosinophils Relative 08/28/2014 0  0 - 5 % Final  . Eosinophils Absolute 08/28/2014 0.0  0.0 - 0.7 K/uL Final  . Basophils Relative 08/28/2014 0  0 - 1 % Final  . Basophils Absolute 08/28/2014 0.0  0.0 - 0.1 K/uL Final  . Sodium 08/28/2014 137  135 - 145 mmol/L Final   Please note change in reference range.  . Potassium 08/28/2014 3.6  3.5 - 5.1 mmol/L Final   Please note change in reference range.  . Chloride 08/28/2014 105  96 - 112 mEq/L Final  . CO2 08/28/2014 23  19 - 32 mmol/L Final  . Glucose, Bld 08/28/2014 98  70 - 99 mg/dL Final  . BUN 08/28/2014 17  6 - 23 mg/dL Final  . Creatinine, Ser 08/28/2014 1.33  0.50 - 1.35 mg/dL Final  .  Calcium 08/28/2014 9.0  8.4 - 10.5 mg/dL Final  . Total Protein 08/28/2014 7.1  6.0 - 8.3 g/dL Final  . Albumin 08/28/2014 4.3  3.5 - 5.2 g/dL Final  . AST 08/28/2014 31  0 - 37 U/L Final  . ALT 08/28/2014 19  0 - 53 U/L Final  . Alkaline Phosphatase 08/28/2014 49  39 - 117 U/L Final  . Total Bilirubin 08/28/2014 0.7  0.3 - 1.2 mg/dL Final  . GFR calc non Af Wyvonnia Lora  08/28/2014 52* >90 mL/min Final  . GFR calc Af Amer 08/28/2014 60* >90 mL/min Final   Comment: (NOTE) The eGFR has been calculated using the CKD EPI equation. This calculation has not been validated in all clinical situations. eGFR's persistently <90 mL/min signify possible Chronic Kidney Disease.   . Anion gap 08/28/2014 9  5 - 15 Final  . Color, Urine 08/28/2014 YELLOW  YELLOW Final  . APPearance 08/28/2014 CLEAR  CLEAR Final  . Specific Gravity, Urine 08/28/2014 1.021  1.005 - 1.030 Final  . pH 08/28/2014 7.5  5.0 - 8.0 Final  . Glucose, UA 08/28/2014 NEGATIVE  NEGATIVE mg/dL Final  . Hgb urine dipstick 08/28/2014 NEGATIVE  NEGATIVE Final  . Bilirubin Urine 08/28/2014 NEGATIVE  NEGATIVE Final  . Ketones, ur 08/28/2014 40* NEGATIVE mg/dL Final  . Protein, ur 08/28/2014 NEGATIVE  NEGATIVE mg/dL Final  . Urobilinogen, UA 08/28/2014 0.2  0.0 - 1.0 mg/dL Final  . Nitrite 08/28/2014 NEGATIVE  NEGATIVE Final  . Leukocytes, UA 08/28/2014 NEGATIVE  NEGATIVE Final   MICROSCOPIC NOT DONE ON URINES WITH NEGATIVE PROTEIN, BLOOD, LEUKOCYTES, NITRITE, OR GLUCOSE <1000 mg/dL.  Marland Kitchen Alcohol, Ethyl (B) 08/28/2014 <5  0 - 9 mg/dL Final   Comment:        LOWEST DETECTABLE LIMIT FOR SERUM ALCOHOL IS 11 mg/dL FOR MEDICAL PURPOSES ONLY   . Salicylate Lvl 84/08/8207 <4.0  2.8 - 20.0 mg/dL Final  . Acetaminophen (Tylenol), Serum 08/28/2014 <10.0* 10 - 30 ug/mL Final   Comment:        THERAPEUTIC CONCENTRATIONS VARY SIGNIFICANTLY. A RANGE OF 10-30 ug/mL MAY BE AN EFFECTIVE CONCENTRATION FOR MANY PATIENTS. HOWEVER, SOME ARE BEST TREATED AT  CONCENTRATIONS OUTSIDE THIS RANGE. ACETAMINOPHEN CONCENTRATIONS >150 ug/mL AT 4 HOURS AFTER INGESTION AND >50 ug/mL AT 12 HOURS AFTER INGESTION ARE OFTEN ASSOCIATED WITH TOXIC REACTIONS.   Marland Kitchen Opiates 08/28/2014 POSITIVE* NONE DETECTED Final  . Cocaine 08/28/2014 NONE DETECTED  NONE DETECTED Final  . Benzodiazepines 08/28/2014 POSITIVE* NONE DETECTED Final  . Amphetamines 08/28/2014 NONE DETECTED  NONE DETECTED Final  . Tetrahydrocannabinol 08/28/2014 NONE DETECTED  NONE DETECTED Final  . Barbiturates 08/28/2014 NONE DETECTED  NONE DETECTED Final   Comment:        DRUG SCREEN FOR MEDICAL PURPOSES ONLY.  IF CONFIRMATION IS NEEDED FOR ANY PURPOSE, NOTIFY LAB WITHIN 5 DAYS.        LOWEST DETECTABLE LIMITS FOR URINE DRUG SCREEN Drug Class       Cutoff (ng/mL) Amphetamine      1000 Barbiturate      200 Benzodiazepine   138 Tricyclics       871 Opiates          300 Cocaine          300 THC              50    Ct Head Wo Contrast  08/28/2014   CLINICAL DATA:  Recent behavioral changes.  EXAM: CT HEAD WITHOUT CONTRAST  TECHNIQUE: Contiguous axial images were obtained from the base of the skull through the vertex without intravenous contrast.  COMPARISON:  None.  FINDINGS: Ventricles, cisterns and other CSF spaces are within normal. There is no mass, mass effect, shift of midline structures or acute hemorrhage. No evidence to suggest acute infarction. Remaining bones and soft tissues are unremarkable.  IMPRESSION: No acute intracranial findings.   Electronically Signed   By: Marin Olp M.D.   On: 08/28/2014 08:14    Assessment/Plan  1.  Memory changes - Ambulatory referral to Neurology

## 2015-03-22 ENCOUNTER — Telehealth: Payer: Self-pay | Admitting: *Deleted

## 2015-03-22 NOTE — Telephone Encounter (Signed)
Patient called and stated that he will pick up his Crestor, Alprazolam and Hydrocodone Rx's on Friday and then they will be all due at the same time and will only have to make one trip a month. I will print hard copy Rx on Friday.

## 2015-03-23 ENCOUNTER — Other Ambulatory Visit: Payer: Self-pay | Admitting: *Deleted

## 2015-03-23 MED ORDER — HYDROCODONE-ACETAMINOPHEN 7.5-325 MG PO TABS: 1.0000 | ORAL_TABLET | Freq: Four times a day (QID) | ORAL | Status: DC | PRN

## 2015-03-23 MED ORDER — ALPRAZOLAM 1 MG PO TABS: ORAL_TABLET | ORAL | Status: DC

## 2015-03-23 NOTE — Telephone Encounter (Signed)
Patient requested and will pick up 

## 2015-03-25 ENCOUNTER — Other Ambulatory Visit: Payer: Self-pay | Admitting: *Deleted

## 2015-04-08 ENCOUNTER — Telehealth: Payer: Self-pay | Admitting: *Deleted

## 2015-04-08 NOTE — Telephone Encounter (Signed)
Patient called and stated that he is having trouble with his bowels. Stated that his stomach is swollen and hurting. Haven't have a bowel movement in over week. States he is getting weaker and don't feel good. I advised patient to go to Urgent Care to be evaluated. Told him not to wait any longer. Patient agreed.

## 2015-04-11 ENCOUNTER — Telehealth: Payer: Self-pay | Admitting: Gastroenterology

## 2015-04-11 NOTE — Telephone Encounter (Signed)
Patient reports that he has not had a BM in 3 days.  He tried one dose of Miralax and yesterday he drank a bottle of Mag Citrate.  He is advised to increase the amount of Miralax he is drinking.  Two to three doses today then titrate.  He is advised to be on a high fiber diet and make sure he has drinking plenty of fluid in his diet.  He will call back if he does not have any results from the increased Miralax doses

## 2015-04-13 ENCOUNTER — Ambulatory Visit (INDEPENDENT_AMBULATORY_CARE_PROVIDER_SITE_OTHER): Payer: Medicare Other | Admitting: Diagnostic Neuroimaging

## 2015-04-13 ENCOUNTER — Encounter: Payer: Self-pay | Admitting: Diagnostic Neuroimaging

## 2015-04-13 VITALS — Ht 69.0 in | Wt 192.0 lb

## 2015-04-13 DIAGNOSIS — F329 Major depressive disorder, single episode, unspecified: Secondary | ICD-10-CM | POA: Diagnosis not present

## 2015-04-13 DIAGNOSIS — R413 Other amnesia: Secondary | ICD-10-CM | POA: Diagnosis not present

## 2015-04-13 DIAGNOSIS — F32A Depression, unspecified: Secondary | ICD-10-CM
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
## 2015-04-14 ENCOUNTER — Telehealth: Payer: Self-pay

## 2015-04-14 NOTE — Telephone Encounter (Signed)
Patient called stated he has had diarrhea for 3 days, afraid to eat anything. Told him I saw he has had trouble with constipation for 3 days and called Dr. Lynne Leader office on 04/11/15. . Told him not to take any more Citrate, and cut back on the Miralox. He kept asking about eating, told him to eat, and drink fluids.

## 2015-04-22 ENCOUNTER — Other Ambulatory Visit: Payer: Self-pay | Admitting: *Deleted

## 2015-04-22 ENCOUNTER — Telehealth: Payer: Self-pay | Admitting: *Deleted

## 2015-04-22 MED ORDER — DULOXETINE HCL 30 MG PO CPEP: 30.0000 mg | ORAL_CAPSULE | Freq: Every day | ORAL | Status: DC

## 2015-04-22 MED ORDER — HYDROCODONE-ACETAMINOPHEN 7.5-325 MG PO TABS: 1.0000 | ORAL_TABLET | Freq: Four times a day (QID) | ORAL | Status: DC | PRN

## 2015-04-22 NOTE — Telephone Encounter (Signed)
I do not think he should be on any higher strength of his pain medicine at this time. He should be seen for further evaluation of the shoulder. He has already had problems with memory and possible medication interactions that confused him and sent him to the emergency room in December 2015.

## 2015-04-22 NOTE — Telephone Encounter (Signed)
Patient called regarding medication change due to left shoulder pain has increased to a 8 on the pain scale. He would like a different medication if possible because his tolerance is very high.Please Advise!

## 2015-04-22 NOTE — Telephone Encounter (Signed)
Logan Hayes came in the office on today. Says he was depression and had some anxiety.  Says he does not feel as if he would harm himself or anybody.  He needs some type of medication for the depression.  Dr. Eulas Post talked with Logan Hayes, after which she spoke with Dr. Nyoka Cowden, Patient PCP...cdavis

## 2015-04-22 NOTE — Telephone Encounter (Signed)
Patient requested and will pick up 

## 2015-04-23 NOTE — Telephone Encounter (Signed)
I spoke with pt while he was in the office 04/22/15. He denies SI/HI. He admits to increased anxiety and feeling overwhelmed and takes alprazolam as directed. He feels isolated but states he did that to himself by pushing away friends and family. He has tried multiple antidepressants in the past. He does not appear to be responding to internal/external auditory/visual hallucinations. He c/o chronic tinnitus x years. He declines ED and mental health referral. No apparent suicidal/homicidal threat today. He is majorily depressed. S/w Dr Nyoka Cowden and he suggested cymbalta  trial. abilify is another option if pt cannot tolerate cymbalta or it is ineffective but cost of med could be a limiting factor. Pt started on cymbalta 30mg  daily (eRx sent to pharmacy). Follow up appt with Dr Nyoka Cowden made.

## 2015-04-25 NOTE — Telephone Encounter (Signed)
Called Logan Hayes, for two reason, to give him his appointment with Dr. Nyoka Cowden for Tuesday, April 26, 2015.  Secondly, check to see how he is doing.  Says he is not depression- stated the medication is working. Stated he did not have any where else to go but to our office. Stated he was glad that we took the time to list. Told [name], we are concerned  about his, especially his   health and safety.  cdavis

## 2015-04-26 ENCOUNTER — Ambulatory Visit (INDEPENDENT_AMBULATORY_CARE_PROVIDER_SITE_OTHER): Payer: Medicare Other | Admitting: Internal Medicine

## 2015-04-26 ENCOUNTER — Encounter: Payer: Self-pay | Admitting: Internal Medicine

## 2015-04-26 VITALS — BP 110/84 | HR 72 | Temp 98.3°F | Resp 20 | Ht 69.0 in | Wt 188.2 lb

## 2015-04-26 DIAGNOSIS — F32A Depression, unspecified: Secondary | ICD-10-CM

## 2015-04-26 DIAGNOSIS — H9313 Tinnitus, bilateral: Secondary | ICD-10-CM | POA: Diagnosis not present

## 2015-04-26 DIAGNOSIS — R413 Other amnesia: Secondary | ICD-10-CM | POA: Diagnosis not present

## 2015-04-26 DIAGNOSIS — F419 Anxiety disorder, unspecified: Secondary | ICD-10-CM

## 2015-04-26 DIAGNOSIS — F329 Major depressive disorder, single episode, unspecified: Secondary | ICD-10-CM | POA: Diagnosis not present

## 2015-04-26 MED ORDER — DULOXETINE HCL 30 MG PO CPEP: ORAL_CAPSULE | ORAL | Status: DC

## 2015-04-26 NOTE — Progress Notes (Signed)
Patient ID: Logan Hayes, male   DOB: 02/13/1941, 74 y.o.   MRN: [id number]    Facility  [facility]    Place of Service:   OFFICE    Allergies  Allergen Reactions  . Donepezil Anaphylaxis    Made memory worse  . Luvox [Fluvoxamine]     Felt worse  . Mirtazapine Other (See Comments)    Restless, weak    Chief Complaint  Patient presents with  . Medical Management of Chronic Issues    Depression,stomach problems    HPI:  Depression - patient was started on duloxetine less than a week ago. He says that within the first 2 days he was better and felt much more relaxed. He denies any depression today.  Anxiety - improved on fluoxetine  Memory changes - has been to see Dr. Penumalli, neurologist. He is aware of cognitive changes. He did not start any new medication. He recommended neuropsychology testing in the home health agency. Dr. Penumalli express concerns about home safety situation, memory loss, lack of social support.  Tinnitus, bilateral - unchanged.    Medications: Patient's Medications  New Prescriptions   No medications on file  Previous Medications   ALPRAZOLAM (XANAX) 1 MG TABLET    Take one tablet by mouth up to four times daily to help anxiety   ASPIRIN 81 MG TABLET    Take 81 mg by mouth daily. Take 1 tablet daily to prevent heart attack and stroke.   CRESTOR 10 MG TABLET    TAKE 1 TABLET BY MOUTH ONCE A DAY TO LOWER CHOLESTEROL   DULOXETINE (CYMBALTA) 30 MG CAPSULE    Take 1 capsule (30 mg total) by mouth daily.   HYDROCODONE-ACETAMINOPHEN (NORCO) 7.5-325 MG PER TABLET    Take 1 tablet by mouth every 6 (six) hours as needed. For pain  Modified Medications   No medications on file  Discontinued Medications   No medications on file     Review of Systems  Constitutional: Positive for activity change and fatigue. Negative for fever, chills and appetite change.  HENT: Negative for ear discharge and ear pain.        Severe tinnitus  Eyes: Negative.     Respiratory: Negative for apnea, cough, shortness of breath and wheezing.   Cardiovascular: Negative for chest pain, palpitations and leg swelling.  Gastrointestinal: Positive for constipation. Negative for nausea, abdominal pain, diarrhea, abdominal distention and rectal pain.  Endocrine: Negative.   Genitourinary:       Slow, weak, stream  Musculoskeletal: Positive for arthralgias (right ankle and shoulders).  Allergic/Immunologic: Negative.   Neurological:       Memory loss  Hematological: Negative.   Psychiatric/Behavioral: Positive for confusion, sleep disturbance and decreased concentration. The patient is nervous/anxious.        Dysphoric mood with hx of depression. Currently taking Cymbalta 30 mg daily.    Filed Vitals:   04/26/15 1515  BP: 110/84  Pulse: 72  Temp: 98.3 F (36.8 C)  TempSrc: Oral  Resp: 20  Height: 5' 9" (1.753 m)  Weight: 188 lb 3.2 oz (85.367 kg)  SpO2: 96%   Body mass index is 27.78 kg/(m^2).  Physical Exam  Constitutional: He is oriented to person, place, and time. He appears well-developed and well-nourished. No distress.  HENT:  Right Ear: External ear normal.  Left Ear: External ear normal.  Nose: Nose normal.  Mouth/Throat: Oropharynx is clear and moist. No oropharyngeal exudate.  Eyes: Conjunctivae and EOM are normal. Pupils   are equal, round, and reactive to light.  Neck: No JVD present. No tracheal deviation present. No thyromegaly present.  Cardiovascular: Normal rate, regular rhythm, normal heart sounds and intact distal pulses.  Exam reveals no gallop and no friction rub.   No murmur heard. Pulmonary/Chest: No respiratory distress. He has no wheezes. He has no rales. He exhibits no tenderness.  Abdominal: He exhibits no distension and no mass. There is no tenderness.  No inguinal adenopathy  Genitourinary:  External skin tag and internal hemorrhoid  Musculoskeletal: Normal range of motion. He exhibits tenderness (shoulders and right  ankle). He exhibits no edema.  Lymphadenopathy:    He has no cervical adenopathy.  Neurological: He is alert and oriented to person, place, and time. He has normal reflexes. No cranial nerve deficit. Coordination normal.  12/06/10 MMSE 27/30. Passed clock drawing. 03/16/15 MMSE 23/30. Passed clock drawing.  Skin: No rash noted. No erythema. No pallor.  Psychiatric: His behavior is normal. Thought content normal.     Labs reviewed: No visits with results within 3 Month(s) from this visit. Latest known visit with results is:  Admission on 08/28/2014, Discharged on 08/28/2014  Component Date Value Ref Range Status  . WBC 08/28/2014 7.6  4.0 - 10.5 K/uL Final  . RBC 08/28/2014 4.64  4.22 - 5.81 MIL/uL Final  . Hemoglobin 08/28/2014 14.0  13.0 - 17.0 g/dL Final  . HCT 08/28/2014 42.9  39.0 - 52.0 % Final  . MCV 08/28/2014 92.5  78.0 - 100.0 fL Final  . MCH 08/28/2014 30.2  26.0 - 34.0 pg Final  . MCHC 08/28/2014 32.6  30.0 - 36.0 g/dL Final  . RDW 08/28/2014 14.5  11.5 - 15.5 % Final  . Platelets 08/28/2014 227  150 - 400 K/uL Final  . Neutrophils Relative % 08/28/2014 71  43 - 77 % Final  . Neutro Abs 08/28/2014 5.3  1.7 - 7.7 K/uL Final  . Lymphocytes Relative 08/28/2014 14  12 - 46 % Final  . Lymphs Abs 08/28/2014 1.1  0.7 - 4.0 K/uL Final  . Monocytes Relative 08/28/2014 15* 3 - 12 % Final  . Monocytes Absolute 08/28/2014 1.2* 0.1 - 1.0 K/uL Final  . Eosinophils Relative 08/28/2014 0  0 - 5 % Final  . Eosinophils Absolute 08/28/2014 0.0  0.0 - 0.7 K/uL Final  . Basophils Relative 08/28/2014 0  0 - 1 % Final  . Basophils Absolute 08/28/2014 0.0  0.0 - 0.1 K/uL Final  . Sodium 08/28/2014 137  135 - 145 mmol/L Final   Please note change in reference range.  . Potassium 08/28/2014 3.6  3.5 - 5.1 mmol/L Final   Please note change in reference range.  . Chloride 08/28/2014 105  96 - 112 mEq/L Final  . CO2 08/28/2014 23  19 - 32 mmol/L Final  . Glucose, Bld 08/28/2014 98  70 - 99 mg/dL  Final  . BUN 08/28/2014 17  6 - 23 mg/dL Final  . Creatinine, Ser 08/28/2014 1.33  0.50 - 1.35 mg/dL Final  . Calcium 08/28/2014 9.0  8.4 - 10.5 mg/dL Final  . Total Protein 08/28/2014 7.1  6.0 - 8.3 g/dL Final  . Albumin 08/28/2014 4.3  3.5 - 5.2 g/dL Final  . AST 08/28/2014 31  0 - 37 U/L Final  . ALT 08/28/2014 19  0 - 53 U/L Final  . Alkaline Phosphatase 08/28/2014 49  39 - 117 U/L Final  . Total Bilirubin 08/28/2014 0.7  0.3 - 1.2 mg/dL Final  .   GFR calc non Af Amer 08/28/2014 52* >90 mL/min Final  . GFR calc Af Amer 08/28/2014 60* >90 mL/min Final   Comment: (NOTE) The eGFR has been calculated using the CKD EPI equation. This calculation has not been validated in all clinical situations. eGFR's persistently <90 mL/min signify possible Chronic Kidney Disease.   . Anion gap 08/28/2014 9  5 - 15 Final  . Color, Urine 08/28/2014 YELLOW  YELLOW Final  . APPearance 08/28/2014 CLEAR  CLEAR Final  . Specific Gravity, Urine 08/28/2014 1.021  1.005 - 1.030 Final  . pH 08/28/2014 7.5  5.0 - 8.0 Final  . Glucose, UA 08/28/2014 NEGATIVE  NEGATIVE mg/dL Final  . Hgb urine dipstick 08/28/2014 NEGATIVE  NEGATIVE Final  . Bilirubin Urine 08/28/2014 NEGATIVE  NEGATIVE Final  . Ketones, ur 08/28/2014 40* NEGATIVE mg/dL Final  . Protein, ur 08/28/2014 NEGATIVE  NEGATIVE mg/dL Final  . Urobilinogen, UA 08/28/2014 0.2  0.0 - 1.0 mg/dL Final  . Nitrite 08/28/2014 NEGATIVE  NEGATIVE Final  . Leukocytes, UA 08/28/2014 NEGATIVE  NEGATIVE Final   MICROSCOPIC NOT DONE ON URINES WITH NEGATIVE PROTEIN, BLOOD, LEUKOCYTES, NITRITE, OR GLUCOSE <1000 mg/dL.  Marland Kitchen Alcohol, Ethyl (B) 08/28/2014 <5  0 - 9 mg/dL Final   Comment:        LOWEST DETECTABLE LIMIT FOR SERUM ALCOHOL IS 11 mg/dL FOR MEDICAL PURPOSES ONLY   . Salicylate Lvl 50/35/4656 <4.0  2.8 - 20.0 mg/dL Final  . Acetaminophen (Tylenol), Serum 08/28/2014 <10.0* 10 - 30 ug/mL Final   Comment:        THERAPEUTIC CONCENTRATIONS VARY SIGNIFICANTLY. A  RANGE OF 10-30 ug/mL MAY BE AN EFFECTIVE CONCENTRATION FOR MANY PATIENTS. HOWEVER, SOME ARE BEST TREATED AT CONCENTRATIONS OUTSIDE THIS RANGE. ACETAMINOPHEN CONCENTRATIONS >150 ug/mL AT 4 HOURS AFTER INGESTION AND >50 ug/mL AT 12 HOURS AFTER INGESTION ARE OFTEN ASSOCIATED WITH TOXIC REACTIONS.   Marland Kitchen Opiates 08/28/2014 POSITIVE* NONE DETECTED Final  . Cocaine 08/28/2014 NONE DETECTED  NONE DETECTED Final  . Benzodiazepines 08/28/2014 POSITIVE* NONE DETECTED Final  . Amphetamines 08/28/2014 NONE DETECTED  NONE DETECTED Final  . Tetrahydrocannabinol 08/28/2014 NONE DETECTED  NONE DETECTED Final  . Barbiturates 08/28/2014 NONE DETECTED  NONE DETECTED Final   Comment:        DRUG SCREEN FOR MEDICAL PURPOSES ONLY.  IF CONFIRMATION IS NEEDED FOR ANY PURPOSE, NOTIFY LAB WITHIN 5 DAYS.        LOWEST DETECTABLE LIMITS FOR URINE DRUG SCREEN Drug Class       Cutoff (ng/mL) Amphetamine      1000 Barbiturate      200 Benzodiazepine   812 Tricyclics       751 Opiates          300 Cocaine          300 THC              50      Assessment/Plan  1. Depression Continue  DULoxetine (CYMBALTA) 30 MG capsule; One daily to help depression and anxiety  Dispense: 30 capsule; Refill: 5  2. Anxiety Continue Cymbalta  3. Memory changes Continue to follow. Unmedicated.  4. Tinnitus, bilateral Chronic problem without change.

## 2015-04-27 ENCOUNTER — Ambulatory Visit: Payer: Medicare Other | Admitting: Internal Medicine

## 2015-05-02 ENCOUNTER — Ambulatory Visit
Admission: RE | Admit: 2015-05-02 | Discharge: 2015-05-02 | Disposition: A | Discharge status: Home / Self Care | Payer: Medicare Other | Source: Ambulatory Visit | Attending: Internal Medicine | Admitting: Internal Medicine

## 2015-05-02 ENCOUNTER — Telehealth: Payer: Self-pay | Admitting: *Deleted

## 2015-05-02 DIAGNOSIS — K5900 Constipation, unspecified: Secondary | ICD-10-CM

## 2015-05-02 NOTE — Telephone Encounter (Signed)
No available appointments.  I spoke with Dr. Nyoka Cowden and we can order a KUB and patient can try 2oz of milk of magnesium and 2oz of mineral oil.  Patient Notified and agreed. X-Ray placed.

## 2015-05-02 NOTE — Telephone Encounter (Signed)
i would recommend an OV if there are any concerns

## 2015-05-02 NOTE — Telephone Encounter (Signed)
Patient called and stated he has not had a bowel movement in 4 days. Not much abdominal pain, but cant eat much. Has tried Miralax and OTC enemas. Patient stated he thinks he Marlette Curvin have blockage or something "twisted" in there. Wants X-Rays done. Please Advise. (Dr. Nyoka Cowden Patient)

## 2015-05-03 NOTE — Telephone Encounter (Signed)
Patient called and wanted to know the results of X-Ray yesterday. Read results to him and he wanted to know why he wasn't able to go to the bathroom. I asked him if he did the 2oz of milk of magnesium and 2oz of mineral oil and he stated no. I explained the direction again to him and had him write it down and told him to go to the pharmacy and the pharmacist would help him get it. He agreed and stated that he would try it.

## 2015-05-12 ENCOUNTER — Telehealth: Payer: Self-pay | Admitting: *Deleted

## 2015-05-12 NOTE — Telephone Encounter (Signed)
Patient has seen Dr. Leta Baptist, neurologist, recently. His memory disorder is documented. He lives by himself. We could call Adult Protective Services to do a home evaluation. I do not think he should be on more sedative drugs at this time.
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
## 2015-05-12 NOTE — Telephone Encounter (Signed)
Called Adult Protective Services [phone] spoke with Elayne Guerin and answered questions and she stated we will receive a letter if he is accepted.

## 2015-05-12 NOTE — Telephone Encounter (Signed)
Called Mr. Harewood, left message to return call...cdavis
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
## 2015-05-19 NOTE — Telephone Encounter (Signed)
Tia called from Adult Protective Services and she stated that they have tried to make several home visits but patient is not at home and not responding to call. She wanted the numbers we had on file for him and his son. Given to her. She asked if he has had a mental evaluation and I told her that he does see a Neurologist and gave her his name and number. She stated that she would try this and let us know. Agreed.

## 2015-05-20 ENCOUNTER — Other Ambulatory Visit: Payer: Self-pay | Admitting: Internal Medicine

## 2015-05-26 ENCOUNTER — Other Ambulatory Visit: Payer: Self-pay | Admitting: *Deleted

## 2015-05-26 MED ORDER — HYDROCODONE-ACETAMINOPHEN 7.5-325 MG PO TABS: 1.0000 | ORAL_TABLET | Freq: Four times a day (QID) | ORAL | Status: DC | PRN

## 2015-05-26 NOTE — Telephone Encounter (Signed)
Patient requested and will pick up 

## 2015-06-14 ENCOUNTER — Ambulatory Visit (INDEPENDENT_AMBULATORY_CARE_PROVIDER_SITE_OTHER): Payer: Medicare Other | Admitting: Internal Medicine

## 2015-06-14 ENCOUNTER — Encounter: Payer: Self-pay | Admitting: Internal Medicine

## 2015-06-14 VITALS — BP 136/86 | HR 66 | Temp 98.3°F | Ht 69.0 in | Wt 184.0 lb

## 2015-06-14 DIAGNOSIS — F329 Major depressive disorder, single episode, unspecified: Secondary | ICD-10-CM

## 2015-06-14 DIAGNOSIS — F419 Anxiety disorder, unspecified: Secondary | ICD-10-CM

## 2015-06-14 DIAGNOSIS — F429 Obsessive-compulsive disorder, unspecified: Secondary | ICD-10-CM | POA: Diagnosis not present

## 2015-06-14 DIAGNOSIS — R413 Other amnesia: Secondary | ICD-10-CM

## 2015-06-14 DIAGNOSIS — F32A Depression, unspecified: Secondary | ICD-10-CM

## 2015-06-14 NOTE — Progress Notes (Signed)
Patient ID: Logan Hayes, male   DOB: [birth year], 74 y.o.   MRN: [id number]    Facility  [facility]    Place of Service:   OFFICE    Allergies  Allergen Reactions  . Donepezil Anaphylaxis    Made memory worse  . Luvox [Fluvoxamine]     Felt worse  . Mirtazapine Other (See Comments)    Restless, weak    Chief Complaint  Patient presents with  . Medical Management of Chronic Issues    depression, anxiety, OCD, memory    HPI:  Depression - improved on Cymbalta  Anxiety - improved  Memory changes - patient believes memory has also improved  OCD (obsessive compulsive disorder) - less consumed by the state of his house. He agrees and he needs to be more physically active and get out of the house more often.    Medications: Patient's Medications  New Prescriptions   No medications on file  Previous Medications   ALPRAZOLAM (XANAX) 1 MG TABLET    TAKE 1 TABLET BY MOUTH UP TO 4 TIMES A DAY AS NEEDED FOR ANXIETY   ASPIRIN 81 MG TABLET    Take 81 mg by mouth daily. Take 1 tablet daily to prevent heart attack and stroke.   CRESTOR 10 MG TABLET    TAKE 1 TABLET BY MOUTH ONCE A DAY TO LOWER CHOLESTEROL   DULOXETINE (CYMBALTA) 30 MG CAPSULE    One daily to help depression and anxiety   HYDROCODONE-ACETAMINOPHEN (NORCO) 7.5-325 MG PER TABLET    Take 1 tablet by mouth every 6 (six) hours as needed. For pain  Modified Medications   No medications on file  Discontinued Medications   No medications on file    Review of Systems  Constitutional: Positive for activity change and fatigue. Negative for fever, chills and appetite change.  HENT: Negative for ear discharge and ear pain.        Severe tinnitus  Eyes: Negative.   Respiratory: Negative for apnea, cough, shortness of breath and wheezing.   Cardiovascular: Negative for chest pain, palpitations and leg swelling.  Gastrointestinal: Positive for constipation. Negative for nausea, abdominal pain, diarrhea, abdominal distention  and rectal pain.  Endocrine: Negative.   Genitourinary:       Slow, weak, stream  Musculoskeletal: Positive for arthralgias (right ankle and shoulders).  Allergic/Immunologic: Negative.   Neurological:       Memory loss  Hematological: Negative.   Psychiatric/Behavioral: Positive for confusion, sleep disturbance and decreased concentration. The patient is nervous/anxious.        Dysphoric mood with hx of depression. Currently taking Cymbalta 30 mg daily.    Filed Vitals:   06/14/15 1333  BP: 136/86  Pulse: 66  Temp: 98.3 F (36.8 C)  TempSrc: Oral  Height: $Remove'5\' 9"'Kkspofh$  (1.753 m)  Weight: 184 lb (83.462 kg)  SpO2: 96%   Body mass index is 27.16 kg/(m^2).  Physical Exam  Constitutional: He is oriented to person, place, and time. He appears well-developed and well-nourished. No distress.  HENT:  Right Ear: External ear normal.  Left Ear: External ear normal.  Nose: Nose normal.  Mouth/Throat: Oropharynx is clear and moist. No oropharyngeal exudate.  Eyes: Conjunctivae and EOM are normal. Pupils are equal, round, and reactive to light.  Neck: No JVD present. No tracheal deviation present. No thyromegaly present.  Cardiovascular: Normal rate, regular rhythm, normal heart sounds and intact distal pulses.  Exam reveals no gallop and no friction rub.   No murmur  heard. Pulmonary/Chest: No respiratory distress. He has no wheezes. He has no rales. He exhibits no tenderness.  Abdominal: He exhibits no distension and no mass. There is no tenderness.  No inguinal adenopathy  Genitourinary:  External skin tag and internal hemorrhoid  Musculoskeletal: Normal range of motion. He exhibits tenderness (shoulders and right ankle). He exhibits no edema.  Lymphadenopathy:    He has no cervical adenopathy.  Neurological: He is alert and oriented to person, place, and time. He has normal reflexes. No cranial nerve deficit. Coordination normal.  12/06/10 MMSE 27/30. Passed clock drawing. 03/16/15 MMSE  23/30. Passed clock drawing.  Skin: No rash noted. No erythema. No pallor.  Psychiatric: His behavior is normal. Thought content normal.    Labs reviewed: Lab Summary Latest Ref Rng 08/28/2014  Hemoglobin 13.0 - 17.0 g/dL 14.0  Hematocrit 39.0 - 52.0 % 42.9  White count 4.0 - 10.5 K/uL 7.6  Platelet count 150 - 400 K/uL 227  Sodium 135 - 145 mmol/L 137  Potassium 3.5 - 5.1 mmol/L 3.6  Calcium 8.4 - 10.5 mg/dL 9.0  Phosphorus - (None)  Creatinine 0.50 - 1.35 mg/dL 1.33  AST 0 - 37 U/L 31  Alk Phos 39 - 117 U/L 49  Bilirubin 0.3 - 1.2 mg/dL 0.7  Glucose 70 - 99 mg/dL 98  Cholesterol - (None)  HDL cholesterol - (None)  Triglycerides - (None)  LDL Direct - (None)  LDL Calc - (None)  Total protein 6.0 - 8.3 g/dL 7.1  Albumin 3.5 - 5.2 g/dL 4.3   No results found for: TSH, T3TOTAL, T4TOTAL, THYROIDAB Lab Results  Component Value Date   BUN 17 08/28/2014   No results found for: HGBA1C  Assessment/Plan 1. Depression Continue Cymbalta Increase physical activity. Discussed riding motorcycle and walking in [place]. Discussed getting another dog.  2. Anxiety Improved  3. Memory changes MMSE next visit  4. OCD (obsessive compulsive disorder) May be showing signs of improvement

## 2015-06-15 ENCOUNTER — Other Ambulatory Visit: Payer: Self-pay | Admitting: Internal Medicine

## 2015-06-16 ENCOUNTER — Other Ambulatory Visit: Payer: Self-pay | Admitting: Internal Medicine

## 2015-06-17 ENCOUNTER — Other Ambulatory Visit: Payer: Self-pay | Admitting: *Deleted

## 2015-06-17 DIAGNOSIS — F329 Major depressive disorder, single episode, unspecified: Secondary | ICD-10-CM

## 2015-06-17 DIAGNOSIS — F32A Depression, unspecified: Secondary | ICD-10-CM

## 2015-06-17 MED ORDER — DULOXETINE HCL 30 MG PO CPEP: ORAL_CAPSULE | ORAL | Status: DC

## 2015-06-17 NOTE — Telephone Encounter (Signed)
[facility] 

## 2015-06-29 ENCOUNTER — Other Ambulatory Visit: Payer: Self-pay

## 2015-06-29 MED ORDER — HYDROCODONE-ACETAMINOPHEN 7.5-325 MG PO TABS: 1.0000 | ORAL_TABLET | Freq: Four times a day (QID) | ORAL | Status: DC | PRN

## 2015-07-14 ENCOUNTER — Other Ambulatory Visit: Payer: Self-pay | Admitting: *Deleted

## 2015-07-14 MED ORDER — ALPRAZOLAM 1 MG PO TABS: ORAL_TABLET | ORAL | Status: DC

## 2015-07-14 NOTE — Telephone Encounter (Signed)
[facility] 

## 2015-07-31 ENCOUNTER — Other Ambulatory Visit: Payer: Self-pay | Admitting: Internal Medicine

## 2015-08-05 ENCOUNTER — Other Ambulatory Visit: Payer: Self-pay | Admitting: *Deleted

## 2015-08-05 MED ORDER — HYDROCODONE-ACETAMINOPHEN 7.5-325 MG PO TABS: 1.0000 | ORAL_TABLET | Freq: Four times a day (QID) | ORAL | Status: DC | PRN

## 2015-08-05 NOTE — Telephone Encounter (Signed)
Patient requested and will pick up 

## 2015-08-17 ENCOUNTER — Other Ambulatory Visit: Payer: Self-pay | Admitting: Internal Medicine

## 2015-09-07 ENCOUNTER — Other Ambulatory Visit: Payer: Self-pay | Admitting: *Deleted

## 2015-09-07 MED ORDER — HYDROCODONE-ACETAMINOPHEN 7.5-325 MG PO TABS: 1.0000 | ORAL_TABLET | Freq: Four times a day (QID) | ORAL | Status: DC | PRN

## 2015-09-07 NOTE — Telephone Encounter (Signed)
Patient requested and pick up

## 2015-09-14 ENCOUNTER — Other Ambulatory Visit: Payer: Self-pay | Admitting: Internal Medicine

## 2015-09-15 ENCOUNTER — Other Ambulatory Visit: Payer: Self-pay | Admitting: Internal Medicine

## 2015-10-18 ENCOUNTER — Encounter: Payer: Self-pay | Admitting: Diagnostic Neuroimaging

## 2015-10-18 ENCOUNTER — Ambulatory Visit (INDEPENDENT_AMBULATORY_CARE_PROVIDER_SITE_OTHER): Payer: Medicare Other | Admitting: Diagnostic Neuroimaging

## 2015-10-18 VITALS — BP 128/76 | HR 63 | Ht 69.0 in | Wt 189.0 lb

## 2015-10-18 DIAGNOSIS — R413 Other amnesia: Secondary | ICD-10-CM | POA: Diagnosis not present

## 2015-10-18 NOTE — Patient Instructions (Signed)
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
## 2015-10-19 ENCOUNTER — Ambulatory Visit (INDEPENDENT_AMBULATORY_CARE_PROVIDER_SITE_OTHER): Payer: Medicare Other | Admitting: Internal Medicine

## 2015-10-19 ENCOUNTER — Encounter: Payer: Self-pay | Admitting: Internal Medicine

## 2015-10-19 VITALS — BP 120/72 | HR 65 | Temp 98.8°F | Resp 20 | Ht 69.0 in | Wt 188.8 lb

## 2015-10-19 DIAGNOSIS — G44219 Episodic tension-type headache, not intractable: Secondary | ICD-10-CM | POA: Diagnosis not present

## 2015-10-19 DIAGNOSIS — K5900 Constipation, unspecified: Secondary | ICD-10-CM

## 2015-10-19 DIAGNOSIS — E785 Hyperlipidemia, unspecified: Secondary | ICD-10-CM

## 2015-10-19 DIAGNOSIS — R413 Other amnesia: Secondary | ICD-10-CM

## 2015-10-19 DIAGNOSIS — M25512 Pain in left shoulder: Secondary | ICD-10-CM | POA: Diagnosis not present

## 2015-10-19 DIAGNOSIS — F32A Depression, unspecified: Secondary | ICD-10-CM

## 2015-10-19 DIAGNOSIS — R519 Headache, unspecified: Secondary | ICD-10-CM | POA: Insufficient documentation

## 2015-10-19 DIAGNOSIS — R51 Headache: Secondary | ICD-10-CM

## 2015-10-19 DIAGNOSIS — F329 Major depressive disorder, single episode, unspecified: Secondary | ICD-10-CM | POA: Diagnosis not present

## 2015-10-19 HISTORY — DX: Headache, unspecified: R51.9

## 2015-10-19 MED ORDER — ALPRAZOLAM 1 MG PO TABS: ORAL_TABLET | ORAL | Status: DC

## 2015-10-19 MED ORDER — LACTULOSE 20 GM/30ML PO SOLN: ORAL | Status: DC

## 2015-10-19 MED ORDER — HYDROCODONE-ACETAMINOPHEN 7.5-325 MG PO TABS: 1.0000 | ORAL_TABLET | Freq: Four times a day (QID) | ORAL | Status: DC | PRN

## 2015-10-19 NOTE — Progress Notes (Signed)
Patient ID: Logan Hayes, male   DOB: Apr 04, 1941, 75 y.o.   MRN: [id number]    Facility  [facility]    Place of Service:   OFFICE    Allergies  Allergen Reactions  . Donepezil Anaphylaxis    Made memory worse  . Luvox [Fluvoxamine]     Felt worse  . Mirtazapine Other (See Comments)    Restless, weak    Chief Complaint  Patient presents with  . Medical Management of Chronic Issues    4 mo f/u ,memory    HPI:  Patient saw Dr. Leta Baptist, neurologist on 10/18/2015. MMSE score improved to 29/30. Dr. 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 We have not completely ruled out other possibilities such as dementia with fluid body disease versus Alzheimer's.  Headache on the temples and sides of the head more frequent and increased pain for about 3 weeks.  "Digestive system is really messed up" . Constipated. Has not had a good BM for 3 weeks. Urinating orange and small quantity. Stool is small and black pieces. Incontinent of stool and urine. Using Miralax, but it did not help. Anorectic, but not nauseous.  Medications: Patient's Medications  New Prescriptions   No medications on file  Previous Medications   ASPIRIN 81 MG TABLET    Take 81 mg by mouth daily. Take 1 tablet daily to prevent heart attack and stroke.   DULOXETINE (CYMBALTA) 30 MG CAPSULE    Take One tablet by mouth once daily to help depression and anxiety   ROSUVASTATIN (CRESTOR) 10 MG TABLET    TAKE 1 TABLET BY MOUTH ONCE A DAY TO LOWER CHOLESTEROL  Modified Medications   Modified Medication Previous Medication   ALPRAZOLAM (XANAX) 1 MG TABLET ALPRAZolam (XANAX) 1 MG tablet      TAKE 1 TABLET BY MOUTH 4 TIMES A DAY AS NEEDED FOR PAIN    TAKE 1 TABLET BY MOUTH 4 TIMES A DAY AS NEEDED FOR PAIN   HYDROCODONE-ACETAMINOPHEN (NORCO) 7.5-325 MG TABLET HYDROcodone-acetaminophen (NORCO) 7.5-325 MG tablet      Take 1 tablet by mouth every 6 (six) hours as needed. For pain     Take 1 tablet by mouth every 6 (six) hours as needed. For pain  Discontinued Medications   No medications on file    Review of Systems  Constitutional: Positive for activity change and fatigue. Negative for fever, chills and appetite change.  HENT: Negative for ear discharge and ear pain.        Severe tinnitus  Eyes: Negative.   Respiratory: Negative for apnea, cough, shortness of breath and wheezing.   Cardiovascular: Negative for chest pain, palpitations and leg swelling.  Gastrointestinal: Positive for constipation. Negative for nausea, abdominal pain, diarrhea, abdominal distention and rectal pain.  Endocrine: Negative.   Genitourinary:       Slow, weak, stream  Musculoskeletal: Positive for arthralgias (right ankle and shoulders).       Pain int he left shoulder. Prior rotator cuff surgery about 2002 by Dr. Gladstone Lighter.  Allergic/Immunologic: Negative.   Neurological:       Memory loss  Hematological: Negative.   Psychiatric/Behavioral: Positive for confusion, sleep disturbance and decreased concentration. The patient is nervous/anxious.        Dysphoric mood with hx of depression. Currently taking Cymbalta 30 mg daily.    Filed Vitals:   10/19/15 1130  BP: 120/72  Pulse: 65  Temp: 98.8 F (37.1 C)  TempSrc: Oral  Resp: 20  Height: 5\' 9"  (1.753 m)  Weight: 188 lb 12.8 oz (85.639 kg)  SpO2: 94%   Body mass index is 27.87 kg/(m^2). Filed Weights   10/19/15 1130  Weight: 188 lb 12.8 oz (85.639 kg)     Physical Exam  Constitutional: He is oriented to person, place, and time. He appears well-developed and well-nourished. No distress.  HENT:  Right Ear: External ear normal.  Left Ear: External ear normal.  Nose: Nose normal.  Mouth/Throat: Oropharynx is clear and moist. No oropharyngeal exudate.  Eyes: Conjunctivae and EOM are normal. Pupils are equal, round, and reactive to light.  Neck: No JVD present. No tracheal deviation present. No thyromegaly present.    Cardiovascular: Normal rate, regular rhythm, normal heart sounds and intact distal pulses.  Exam reveals no gallop and no friction rub.   No murmur heard. Pulmonary/Chest: No respiratory distress. He has no wheezes. He has no rales. He exhibits no tenderness.  Abdominal: He exhibits no distension and no mass. There is no tenderness.  No inguinal adenopathy  Genitourinary:  External skin tag and internal hemorrhoid  Musculoskeletal: Normal range of motion. He exhibits tenderness (shoulders and right ankle). He exhibits no edema.  Lymphadenopathy:    He has no cervical adenopathy.  Neurological: He is alert and oriented to person, place, and time. He has normal reflexes. No cranial nerve deficit. Coordination normal.  12/06/10 MMSE 27/30. Passed clock drawing. 03/16/15 MMSE 23/30. Passed clock drawing. 10/18/15 MMSE 29/30.  Skin: No rash noted. No erythema. No pallor.  Psychiatric: His behavior is normal. Thought content normal.    Labs reviewed: No flowsheet data found. No results found for: TSH, T3TOTAL, T4TOTAL, THYROIDAB Lab Results  Component Value Date   BUN 17 08/28/2014   BUN 14 04/26/2014   BUN 15 03/18/2013   No results found for: HGBA1C  Assessment/Plan  1. Memory changes improved  2. Hyperlipidemia - Lipid panel; Future  3. Episodic tension-type headache, not intractable Use Tylenol  4. Constipation, unspecified constipation type - Lactulose 20 GM/30ML SOLN; 2 ounces twice daily until having regular stools, then reduce dose  Dispense: 946 mL; Refill: 6  5. Depression Continue Cymbalta   6. [name] of left shoulder Offered ortho referral, but he does not want at this time

## 2015-10-19 NOTE — Patient Instructions (Signed)
Call for earlier appointment if problems with constipation and incontinence persist.

## 2015-11-15 ENCOUNTER — Other Ambulatory Visit: Payer: Self-pay

## 2015-11-15 ENCOUNTER — Other Ambulatory Visit: Payer: Self-pay | Admitting: Internal Medicine

## 2015-11-16 ENCOUNTER — Other Ambulatory Visit: Payer: Self-pay | Admitting: *Deleted

## 2015-11-16 MED ORDER — HYDROCODONE-ACETAMINOPHEN 7.5-325 MG PO TABS: 1.0000 | ORAL_TABLET | Freq: Four times a day (QID) | ORAL | Status: DC | PRN

## 2015-11-16 MED ORDER — ALPRAZOLAM 1 MG PO TABS: ORAL_TABLET | ORAL | Status: DC

## 2015-11-16 NOTE — Telephone Encounter (Signed)
On list to print today. Patient told to pick up Wednesday.

## 2015-11-17 ENCOUNTER — Other Ambulatory Visit: Payer: Self-pay

## 2015-11-17 ENCOUNTER — Other Ambulatory Visit: Payer: Self-pay | Admitting: Internal Medicine

## 2015-11-17 DIAGNOSIS — F329 Major depressive disorder, single episode, unspecified: Secondary | ICD-10-CM

## 2015-11-17 DIAGNOSIS — F32A Depression, unspecified: Secondary | ICD-10-CM

## 2015-11-17 MED ORDER — DULOXETINE HCL 30 MG PO CPEP: ORAL_CAPSULE | ORAL | Status: DC

## 2015-11-17 NOTE — Telephone Encounter (Signed)
Received a refill request from [facility]  stating that patient was transferring prescriptions from [facility] and refills were needed.   Xanax 1 mg prescription was sent to [facility] on 11-16-15. [facility] was notified that they would have to transfer that prescription from [facility].   Cymbalta 30 mg prescription sent to [facility]  today.

## 2015-12-15 ENCOUNTER — Other Ambulatory Visit: Payer: Self-pay | Admitting: Internal Medicine

## 2015-12-16 ENCOUNTER — Other Ambulatory Visit: Payer: Self-pay | Admitting: Internal Medicine

## 2015-12-19 ENCOUNTER — Other Ambulatory Visit: Payer: Self-pay | Admitting: *Deleted

## 2015-12-19 MED ORDER — HYDROCODONE-ACETAMINOPHEN 7.5-325 MG PO TABS: 1.0000 | ORAL_TABLET | Freq: Four times a day (QID) | ORAL | Status: DC | PRN

## 2015-12-19 NOTE — Telephone Encounter (Signed)
Patient requested and will pick up 

## 2016-01-02 ENCOUNTER — Other Ambulatory Visit: Payer: Self-pay | Admitting: Internal Medicine

## 2016-01-16 ENCOUNTER — Other Ambulatory Visit: Payer: Self-pay | Admitting: Internal Medicine

## 2016-01-16 ENCOUNTER — Telehealth: Payer: Self-pay | Admitting: *Deleted

## 2016-01-16 NOTE — Telephone Encounter (Signed)
Received fax form from [facility] [phone] for Prior Authorization for patient's Alprazolam. Filled out fax form and given to Dr. Nyoka Cowden to review and sign. To be faxed back to [facility]                        Fax#: [phone] Member ID: [id number]

## 2016-01-17 ENCOUNTER — Other Ambulatory Visit: Payer: Self-pay | Admitting: *Deleted

## 2016-01-17 MED ORDER — ALPRAZOLAM 1 MG PO TABS: ORAL_TABLET | ORAL | Status: DC

## 2016-01-17 MED ORDER — HYDROCODONE-ACETAMINOPHEN 7.5-325 MG PO TABS: 1.0000 | ORAL_TABLET | Freq: Four times a day (QID) | ORAL | Status: DC | PRN

## 2016-01-17 NOTE — Telephone Encounter (Signed)
Dr. Nyoka Cowden signed and faxed to [facility].

## 2016-01-17 NOTE — Telephone Encounter (Signed)
Patient walked in thinking it was Wednesday and requested a day early. Dr. Nyoka Cowden approved. Printed.

## 2016-01-18 ENCOUNTER — Telehealth: Payer: Self-pay

## 2016-01-18 NOTE — Telephone Encounter (Signed)
Logan Hayes with [employer] call to inform Dr.Green that coverage for Alprazolam has been denied (non-formulary). A letter will be sent with a full explanation of denial and to give an option for an appeal. Formulary alternative includes Lorazepam.  Please advise

## 2016-01-24 NOTE — Telephone Encounter (Signed)
Letter received from [facility] with full explanation of why medication was denied. Letter placed in Dr.Green's review and sign folder.

## 2016-01-25 MED ORDER — LORAZEPAM 1 MG PO TABS: ORAL_TABLET | ORAL | Status: DC

## 2016-01-25 NOTE — Telephone Encounter (Signed)
noted 

## 2016-01-25 NOTE — Telephone Encounter (Signed)
Per Dr. Illene Labrador recommend Lorazepam 1mg  (120tabs) one up to 4 times daily for anxiety.  Patient notified of the change. Patient did just pay $53.00 on 01/16/16 for his Alpraxolam.  Medication list updated and to print next Rx on 6/15. Patient agreed.

## 2016-02-21 ENCOUNTER — Other Ambulatory Visit: Payer: Self-pay | Admitting: Internal Medicine

## 2016-02-24 ENCOUNTER — Other Ambulatory Visit: Payer: Medicare Other

## 2016-02-24 ENCOUNTER — Other Ambulatory Visit: Payer: Self-pay | Admitting: *Deleted

## 2016-02-24 DIAGNOSIS — E785 Hyperlipidemia, unspecified: Secondary | ICD-10-CM

## 2016-02-24 MED ORDER — LORAZEPAM 1 MG PO TABS
ORAL_TABLET | ORAL | Status: DC
Start: 2016-02-24 — End: 2016-02-28

## 2016-02-24 MED ORDER — HYDROCODONE-ACETAMINOPHEN 7.5-325 MG PO TABS: 1.0000 | ORAL_TABLET | Freq: Four times a day (QID) | ORAL | Status: AC | PRN

## 2016-02-24 NOTE — Telephone Encounter (Signed)
Patient requested and will pick up 

## 2016-02-25 LAB — LIPID PANEL
CHOL/HDL RATIO: 2.8 ratio (ref 0.0–5.0)
Cholesterol, Total: 131 mg/dL (ref 100–199)
HDL: 47 mg/dL (ref >39)
LDL Calculated: 48 mg/dL (ref 0–99)
Triglycerides: 182 mg/dL — ABNORMAL HIGH (ref 0–149)
VLDL CHOLESTEROL CAL: 36 mg/dL (ref 5–40)

## 2016-02-27 ENCOUNTER — Telehealth: Payer: Self-pay

## 2016-02-27 NOTE — Telephone Encounter (Signed)
Prior authorization was received for lorazepam 1 mg tablet. PA was initiated via [email or web address], Keyword [id number]   Awaiting determination.

## 2016-02-28 ENCOUNTER — Encounter: Payer: Self-pay | Admitting: Internal Medicine

## 2016-02-28 ENCOUNTER — Ambulatory Visit (INDEPENDENT_AMBULATORY_CARE_PROVIDER_SITE_OTHER): Payer: Medicare Other | Admitting: Internal Medicine

## 2016-02-28 VITALS — BP 112/80 | HR 74 | Temp 98.5°F | Resp 20 | Ht 69.0 in | Wt 193.8 lb

## 2016-02-28 DIAGNOSIS — L03012 Cellulitis of left finger: Secondary | ICD-10-CM | POA: Diagnosis not present

## 2016-02-28 DIAGNOSIS — B372 Candidiasis of skin and nail: Secondary | ICD-10-CM | POA: Diagnosis not present

## 2016-02-28 DIAGNOSIS — F329 Major depressive disorder, single episode, unspecified: Secondary | ICD-10-CM

## 2016-02-28 DIAGNOSIS — E669 Obesity, unspecified: Secondary | ICD-10-CM

## 2016-02-28 DIAGNOSIS — E785 Hyperlipidemia, unspecified: Secondary | ICD-10-CM | POA: Diagnosis not present

## 2016-02-28 DIAGNOSIS — F32A Depression, unspecified: Secondary | ICD-10-CM

## 2016-02-28 DIAGNOSIS — L0390 Cellulitis, unspecified: Secondary | ICD-10-CM | POA: Insufficient documentation

## 2016-02-28 DIAGNOSIS — H9313 Tinnitus, bilateral: Secondary | ICD-10-CM

## 2016-02-28 DIAGNOSIS — R413 Other amnesia: Secondary | ICD-10-CM | POA: Diagnosis not present

## 2016-02-28 MED ORDER — FLUCONAZOLE 100 MG PO TABS: ORAL_TABLET | ORAL | Status: AC

## 2016-02-28 MED ORDER — NYSTATIN 100000 UNIT/GM EX CREA: TOPICAL_CREAM | CUTANEOUS | Status: AC

## 2016-02-28 MED ORDER — SULFAMETHOXAZOLE-TRIMETHOPRIM 800-160 MG PO TABS: ORAL_TABLET | ORAL | Status: AC

## 2016-02-28 NOTE — Progress Notes (Signed)
Patient ID: Logan Hayes, male   DOB: [birth year], 75 y.o.   MRN: [id number]    Facility  [facility]    Place of Service:   OFFICE    Allergies  Allergen Reactions  . Donepezil Anaphylaxis    Made memory worse  . Luvox [Fluvoxamine]     Felt worse  . Mirtazapine Other (See Comments)    Restless, weak    Chief Complaint  Patient presents with  . Medical Management of Chronic Issues    HPI:  Left index finger red and swollen in the middle phalanx for 1 week. Pus in open area.  Scrotal fungal infection and a 1 cm lump in the right side near the groin. Very itchy.  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  Depression - currently taking Cymbalta regularly. Depression continues to be a problem. Patient has difficulty with decision making.  Obesity - gained 5 pounds since last visit. Very sedentary lifestyle. Rides a motorcycle from time to time.   Medications: Patient's Medications  New Prescriptions   No medications on file  Previous Medications   ALPRAZOLAM (XANAX) 1 MG TABLET    TAKE 1 TABLET BY MOUTH 4 TIMES A DAY AS NEEDED FOR ANXIETY   ASPIRIN 81 MG TABLET    Take 81 mg by mouth daily. Take 1 tablet daily to prevent heart attack and stroke.   DULOXETINE (CYMBALTA) 30 MG CAPSULE    TAKE ONE CAPSULE BY MOUTH EVERY DAY TO HELP DEPRESSION AND ANXIETY   HYDROCODONE-ACETAMINOPHEN (NORCO) 7.5-325 MG TABLET    Take 1 tablet by mouth every 6 (six) hours as needed. For pain   LACTULOSE (CHRONULAC) 10 GM/15ML SOLUTION    TAKE 2OZ BY MOUTH TWICE A DAY UNTIL HAVING REGULAR STOOLS, THEN REDUCE DOSE   LORAZEPAM (ATIVAN) 1 MG TABLET    Take one tablet by mouth up to four times daily for anxiety   ROSUVASTATIN (CRESTOR) 10 MG TABLET    TAKE 1 TABLET BY MOUTH ONCE A DAY TO LOWER CHOLESTEROL  Modified Medications   No medications on file  Discontinued Medications   DULOXETINE (CYMBALTA) 30 MG CAPSULE    Take One tablet by mouth once daily to help depression and anxiety    Review of Systems  Constitutional: Positive for activity change and fatigue. Negative for fever, chills and appetite change.  HENT: Negative for ear discharge and ear pain.        Severe tinnitus  Eyes: Negative.   Respiratory: Negative for apnea, cough, shortness of breath and wheezing.   Cardiovascular: Negative for chest pain, palpitations and leg swelling.  Gastrointestinal: Positive for constipation. Negative for nausea, abdominal pain, diarrhea, abdominal distention and rectal pain.  Endocrine: Negative.   Genitourinary:       Slow, weak, stream  Musculoskeletal: Positive for arthralgias (right ankle and shoulders).       Pain int he left shoulder. Prior rotator cuff surgery about 2002 by Dr. Gladstone Lighter.  Skin:       Intensely itchy rash in the groin bilaterally as well as on the scrotum. There is also a lump in the upper right scrotal sac area close to the groin which is about 1 cm in diameter. It is not clinically tender to palpation. Left index finger has a purulent area and is erythematous and swollen at the middle phalanx. Swelling is throughout the left hand as  well. There is some difficulty in closing hand to make fist.  Allergic/Immunologic: Negative.   Neurological:       Memory loss  Hematological: Negative.   Psychiatric/Behavioral: Positive for confusion, sleep disturbance and decreased concentration. The patient is nervous/anxious.        Dysphoric mood with hx of depression. Currently taking Cymbalta 30 mg daily.    Filed Vitals:   02/28/16 1331  BP: 112/80  Pulse: 74  Temp: 98.5 F (36.9 C)  TempSrc: Oral  Resp: 20  Height: '5\' 9"'$  (1.753 m)  Weight: 193 lb 12.8 oz (87.907 kg)  SpO2: 95%   Body mass index is 28.61 kg/(m^2). Filed Weights   02/28/16 1331  Weight: 193 lb 12.8 oz (87.907 kg)     Physical Exam  Constitutional: He is oriented to  person, place, and time. He appears well-developed and well-nourished. No distress.  HENT:  Right Ear: External ear normal.  Left Ear: External ear normal.  Nose: Nose normal.  Mouth/Throat: Oropharynx is clear and moist. No oropharyngeal exudate.  Eyes: Conjunctivae and EOM are normal. Pupils are equal, round, and reactive to light.  Neck: No JVD present. No tracheal deviation present. No thyromegaly present.  Cardiovascular: Normal rate, regular rhythm, normal heart sounds and intact distal pulses.  Exam reveals no gallop and no friction rub.   No murmur heard. Pulmonary/Chest: No respiratory distress. He has no wheezes. He has no rales. He exhibits no tenderness.  Abdominal: He exhibits no distension and no mass. There is no tenderness.  No inguinal adenopathy  Genitourinary:  External skin tag and internal hemorrhoid  Musculoskeletal: Normal range of motion. He exhibits tenderness (shoulders and right ankle). He exhibits no edema.  Lymphadenopathy:    He has no cervical adenopathy.  Neurological: He is alert and oriented to person, place, and time. He has normal reflexes. No cranial nerve deficit. Coordination normal.  12/06/10 MMSE 27/30. Passed clock drawing. 03/16/15 MMSE 23/30. Passed clock drawing. 10/18/15 MMSE 29/30.  Skin: Rash (The groin bilaterally and scrotum with erythema and some scaling particularly in the groin) noted. No erythema. No pallor.  Cellulitis left index finger middle phalanx with erythema and swelling extending into the hand  Psychiatric: His behavior is normal. Thought content normal.    Labs reviewed: Lab Summary Latest Ref Rng 02/24/2016  Hemoglobin 13.0-17.0 g/dL (None)  Hematocrit 39.0-52.0 % (None)  White count - (None)  Platelet count - (None)  Sodium 135-145 mmol/L (None)  Potassium 3.5-5.1 mmol/L (None)  Calcium - (None)  Phosphorus - (None)  Creatinine 0.50-1.10 mg/dL (None)  AST - (None)  Alk Phos - (None)  Bilirubin - (None)  Glucose  70-99 mg/dL (None)  Cholesterol - (None)  HDL cholesterol >39 mg/dL 47  Triglycerides 0 - 149 mg/dL 182(H)  LDL Direct - (None)  LDL Calc 0 - 99 mg/dL 48  Total protein - (None)  Albumin - (None)   No results found for: TSH, T3TOTAL, T4TOTAL, THYROIDAB Lab Results  Component Value Date   BUN 17 08/28/2014   BUN 14 04/26/2014   BUN 15 03/18/2013   No results found for: HGBA1C  Assessment/Plan  1. Hyperlipidemia Continue Crestor  2. Memory changes Consider MMSE follow-up next visit  3. Tinnitus, bilateral Unchanged  4. Depression Continue Cymbalta 30 mg daily  5. Obesity Counseled on diet and weight loss  6. Cellulitis of finger of left hand - sulfamethoxazole-trimethoprim (BACTRIM DS,SEPTRA DS) 800-160 MG tablet; One twice daily to treat infection  Dispense: 20  tablet; Refill: 0  7. Monilial rash - fluconazole (DIFLUCAN) 100 MG tablet; 1 tablet daily for 3 days for yeast infection  Dispense: 3 tablet; Refill: 0 - nystatin cream (MYCOSTATIN); Apply twice daily to the yeast rash at scrotum ans groin  Dispense: 30 g; Refill: 3 - Comprehensive metabolic panel; Future

## 2016-02-28 NOTE — Telephone Encounter (Signed)
Received call from [facility] with [employer] [phone]  and Lorazepam was APPROVED for 1 year.

## 2016-03-01 ENCOUNTER — Telehealth: Payer: Self-pay | Admitting: *Deleted

## 2016-03-01 NOTE — Telephone Encounter (Signed)
FYI: Logan Hayes called thinking that he had missed his appointment for his physical today. I informed him that he had not missed his appointment at all, and he was not due to come back until Oct.

## 2016-03-07 ENCOUNTER — Other Ambulatory Visit: Payer: Self-pay | Admitting: *Deleted

## 2016-03-07 MED ORDER — ALPRAZOLAM 1 MG PO TABS: 1.0000 mg | ORAL_TABLET | Freq: Three times a day (TID) | ORAL | Status: AC | PRN

## 2016-03-15 ENCOUNTER — Emergency Department (HOSPITAL_COMMUNITY): Payer: Medicare Other

## 2016-03-15 ENCOUNTER — Encounter (HOSPITAL_COMMUNITY): Payer: Self-pay

## 2016-03-15 ENCOUNTER — Telehealth: Payer: Self-pay | Admitting: Internal Medicine

## 2016-03-15 ENCOUNTER — Telehealth: Payer: Self-pay

## 2016-03-15 ENCOUNTER — Emergency Department (HOSPITAL_COMMUNITY)
Admission: EM | Admit: 2016-03-15 | Discharge: 2016-03-17 | Disposition: A | Discharge status: Home / Self Care | Payer: Medicare Other | Attending: Emergency Medicine | Admitting: Emergency Medicine

## 2016-03-15 DIAGNOSIS — L02511 Cutaneous abscess of right hand: Secondary | ICD-10-CM | POA: Diagnosis not present

## 2016-03-15 DIAGNOSIS — R4182 Altered mental status, unspecified: Secondary | ICD-10-CM | POA: Diagnosis present

## 2016-03-15 DIAGNOSIS — R410 Disorientation, unspecified: Secondary | ICD-10-CM

## 2016-03-15 DIAGNOSIS — Z7982 Long term (current) use of aspirin: Secondary | ICD-10-CM | POA: Insufficient documentation

## 2016-03-15 DIAGNOSIS — Z87891 Personal history of nicotine dependence: Secondary | ICD-10-CM | POA: Diagnosis not present

## 2016-03-15 LAB — CBC WITH DIFFERENTIAL/PLATELET
BASOS ABS: 0 10*3/uL (ref 0.0–0.1)
BASOS PCT: 0 %
Eosinophils Absolute: 0 10*3/uL (ref 0.0–0.7)
Eosinophils Relative: 0 %
HCT: 42 % (ref 39.0–52.0)
HEMOGLOBIN: 13.8 g/dL (ref 13.0–17.0)
LYMPHS PCT: 11 %
Lymphs Abs: 1 10*3/uL (ref 0.7–4.0)
MCH: 29.5 pg (ref 26.0–34.0)
MCHC: 32.9 g/dL (ref 30.0–36.0)
MCV: 89.7 fL (ref 78.0–100.0)
Monocytes Absolute: 0.5 10*3/uL (ref 0.1–1.0)
Monocytes Relative: 6 %
NEUTROS ABS: 7.2 10*3/uL (ref 1.7–7.7)
NEUTROS PCT: 83 %
Platelets: 267 10*3/uL (ref 150–400)
RBC: 4.68 MIL/uL (ref 4.22–5.81)
RDW: 13.1 % (ref 11.5–15.5)
WBC: 8.7 10*3/uL (ref 4.0–10.5)

## 2016-03-15 LAB — COMPREHENSIVE METABOLIC PANEL
ALBUMIN: 4.4 g/dL (ref 3.5–5.0)
ALT: 22 U/L (ref 17–63)
ANION GAP: 12 (ref 5–15)
AST: 33 U/L (ref 15–41)
Alkaline Phosphatase: 50 U/L (ref 38–126)
BUN: 24 mg/dL — ABNORMAL HIGH (ref 6–20)
CHLORIDE: 104 mmol/L (ref 101–111)
CO2: 23 mmol/L (ref 22–32)
CREATININE: 1.54 mg/dL — AB (ref 0.61–1.24)
Calcium: 9.7 mg/dL (ref 8.9–10.3)
GFR calc non Af Amer: 43 mL/min — ABNORMAL LOW (ref >60)
GFR, EST AFRICAN AMERICAN: 50 mL/min — AB (ref >60)
GLUCOSE: 129 mg/dL — AB (ref 65–99)
Potassium: 3.5 mmol/L (ref 3.5–5.1)
SODIUM: 139 mmol/L (ref 135–145)
Total Bilirubin: 0.8 mg/dL (ref 0.3–1.2)
Total Protein: 7.3 g/dL (ref 6.5–8.1)

## 2016-03-15 LAB — URINALYSIS, ROUTINE W REFLEX MICROSCOPIC
Glucose, UA: NEGATIVE mg/dL
Hgb urine dipstick: NEGATIVE
KETONES UR: 40 mg/dL — AB
LEUKOCYTES UA: NEGATIVE
NITRITE: NEGATIVE
PH: 7 (ref 5.0–8.0)
Protein, ur: 30 mg/dL — AB
SPECIFIC GRAVITY, URINE: 1.031 — AB (ref 1.005–1.030)

## 2016-03-15 LAB — TROPONIN I

## 2016-03-15 LAB — BRAIN NATRIURETIC PEPTIDE: B NATRIURETIC PEPTIDE 5: 28.4 pg/mL (ref 0.0–100.0)

## 2016-03-15 LAB — URINE MICROSCOPIC-ADD ON

## 2016-03-15 LAB — LIPASE, BLOOD: Lipase: 15 U/L (ref 11–51)

## 2016-03-15 MED ORDER — SULFAMETHOXAZOLE-TRIMETHOPRIM 800-160 MG PO TABS
1.0000 | ORAL_TABLET | Freq: Two times a day (BID) | ORAL | Status: DC
  Administered 2016-03-15 – 2016-03-17 (×4): 1 via ORAL
  Filled 2016-03-15 (×4): qty 1

## 2016-03-15 MED ORDER — ACETAMINOPHEN 500 MG PO TABS
1000.0000 mg | ORAL_TABLET | Freq: Once | ORAL | Status: AC
  Administered 2016-03-15: 1000 mg via ORAL
  Filled 2016-03-15: qty 2

## 2016-03-15 MED ORDER — NYSTATIN 100000 UNIT/GM EX CREA
TOPICAL_CREAM | Freq: Two times a day (BID) | CUTANEOUS | Status: DC
  Administered 2016-03-16: 11:00:00 via TOPICAL
  Filled 2016-03-15: qty 15

## 2016-03-15 MED ORDER — SODIUM CHLORIDE 0.9 % IV BOLUS (SEPSIS)
1000.0000 mL | Freq: Once | INTRAVENOUS | Status: AC
  Administered 2016-03-15: 1000 mL via INTRAVENOUS

## 2016-03-15 MED ORDER — DULOXETINE HCL 30 MG PO CPEP
30.0000 mg | ORAL_CAPSULE | Freq: Every day | ORAL | Status: DC
  Administered 2016-03-15 – 2016-03-16 (×2): 30 mg via ORAL
  Filled 2016-03-15 (×3): qty 1

## 2016-03-15 MED ORDER — ROSUVASTATIN CALCIUM 10 MG PO TABS
10.0000 mg | ORAL_TABLET | Freq: Every day | ORAL | Status: DC
  Administered 2016-03-15 – 2016-03-16 (×2): 10 mg via ORAL
  Filled 2016-03-15 (×3): qty 1

## 2016-03-15 MED ORDER — FLUCONAZOLE 100 MG PO TABS
100.0000 mg | ORAL_TABLET | Freq: Once | ORAL | Status: AC
  Administered 2016-03-15: 100 mg via ORAL
  Filled 2016-03-15: qty 1

## 2016-03-15 NOTE — ED Provider Notes (Signed)
INCISION AND DRAINAGE Performed by: Blanchie Dessert Consent: Verbal consent obtained. Risks and benefits: risks, benefits and alternatives were discussed Type: abscess  Body area: right index finger  Anesthesia: none Incision made with 18g needle Complexity: simple Drainage: purulent  Drainage amount: 61mL  Packing material: none  Patient tolerance: Patient tolerated the procedure well with no immediate complications.     Blanchie Dessert, MD 03/15/16 1902

## 2016-03-15 NOTE — ED Notes (Signed)
MD at bedside. 

## 2016-03-15 NOTE — ED Notes (Signed)
Pt given meal tray.

## 2016-03-15 NOTE — ED Notes (Signed)
Pt arrives EMS with c/o altered mental status. Pt found wandering at [place] Has intermittent c/o pain with various locations. Swollen, rred area noted at left index finger.

## 2016-03-15 NOTE — Progress Notes (Signed)
CSW has contacted [name], Tucker Kintigh, [phone]) who is en route to the hospital and would like to discuss discharge planning upon his arrival. CSW to meet with [name] when he arrives.          Emiliano Dyer, LCSW [facility] ED/6M Clinical Social Worker [phone]

## 2016-03-15 NOTE — ED Notes (Signed)
Pt intermittently co pain at different sites then denies pain in the next statement.

## 2016-03-15 NOTE — ED Provider Notes (Signed)
CSN: [id number]     Arrival date & time 03/15/16  [id number] History   First MD Initiated Contact with Patient 03/15/16 [phone]     Chief Complaint  Patient presents with  . Altered Mental Status     HPI  Patient presents via EMS. Patient has altered mental status, level V caveat. Patient was found at a local fast food restaurant wondering. The patient himself gives a waxing/waning account of what is happening, seemingly denies pain, but then starts describe pain in a variety of areas. Patient cannot specify why he is here, how he has been doing recently. EMS providers note patient was confused throughout transport, but was hemodynamically stable.   Past Medical History  Diagnosis Date  . Anxiety 02/14/1999  . Arthritis   . GERD (gastroesophageal reflux disease) 09/14/1998  . Glaucoma 02/13/2006  . Hyperlipidemia 04/04/2005  . Chronic joint pain   . IBS (irritable bowel syndrome)   . Erosive esophagitis   . Hiatal hernia   . Internal hemorrhoid   . Adenomatous polyp of colon 04/2004  . [name], shoulder region 12/05/2001  . Tension headache 02/13/1994  . Depressive disorder, not elsewhere classified 02/14/1972  . Unspecified tinnitus 1942/10/31  . Unspecified vitamin D deficiency   . Hypertrophy of prostate with urinary obstruction and other lower urinary tract symptoms (LUTS)   . Impotence of organic origin   . Insomnia, unspecified   . Attention or concentration deficit   . Tinnitus 1980  . Headache 10/19/2015   Past Surgical History  Procedure Laterality Date  . Ankle fracture surgery Left 2002    Dr. Gladstone Lighter  . Rotator cuff repair      left  . Vasectomy  1970  . Kidney stone surgery  1973   Family History  Problem Relation Age of Onset  . Colon cancer Neg Hx   . Stomach cancer Neg Hx   . Diabetes Mother   . Leukemia Mother   . Heart disease Father    Social History  Substance Use Topics  . Smoking status: Former Smoker    Quit date: 08/09/1974  .  Smokeless tobacco: Never Used  . Alcohol Use: No    Review of Systems  Unable to perform ROS: Dementia      Allergies  Donepezil; Luvox; and Mirtazapine  Home Medications   Prior to Admission medications   Medication Sig Start Date End Date Taking? Authorizing Provider  ALPRAZolam Duanne Moron) 1 MG tablet Take 1 tablet (1 mg total) by mouth 3 (three) times daily as needed for anxiety. 03/07/16   Estill Dooms, MD  aspirin 81 MG tablet Take 81 mg by mouth daily. Take 1 tablet daily to prevent heart attack and stroke.    Historical Provider, MD  DULoxetine (CYMBALTA) 30 MG capsule TAKE ONE CAPSULE BY MOUTH EVERY DAY TO HELP DEPRESSION AND ANXIETY 01/02/16   Estill Dooms, MD  fluconazole (DIFLUCAN) 100 MG tablet 1 tablet daily for 3 days for yeast infection 02/28/16   Estill Dooms, MD  HYDROcodone-acetaminophen (NORCO) 7.5-325 MG tablet Take 1 tablet by mouth every 6 (six) hours as needed. For pain 02/24/16   Tiffany L Reed, DO  lactulose ([place]) 10 GM/15ML solution TAKE 2OZ BY MOUTH TWICE A DAY UNTIL HAVING REGULAR STOOLS, THEN REDUCE DOSE 12/15/15   Estill Dooms, MD  nystatin cream (MYCOSTATIN) Apply twice daily to the yeast rash at scrotum ans groin 02/28/16   Estill Dooms, MD  rosuvastatin (CRESTOR) 10 MG  tablet TAKE 1 TABLET BY MOUTH ONCE A DAY TO LOWER CHOLESTEROL 09/16/15   Estill Dooms, MD  sulfamethoxazole-trimethoprim (BACTRIM DS,SEPTRA DS) 800-160 MG tablet One twice daily to treat infection 02/28/16   Estill Dooms, MD   Pulse 105  Temp(Src) 99.1 F (37.3 C) (Oral)  Resp 21  Ht 5\' 9"  (1.753 m)  Wt 190 lb (86.183 kg)  BMI 28.05 kg/m2  SpO2 96% Physical Exam  Constitutional: He appears well-developed. No distress.  HENT:  Head: Normocephalic and atraumatic.  Eyes: Conjunctivae and EOM are normal.  Cardiovascular: Normal rate and regular rhythm.   Pulmonary/Chest: Effort normal. No stridor. No respiratory distress.  Abdominal: He exhibits no distension.    Genitourinary:     Musculoskeletal: He exhibits no edema.  Neurological: He is alert. He displays atrophy and tremor. No cranial nerve deficit. He exhibits normal muscle tone. He displays no seizure activity. Coordination normal.  Skin: Skin is warm and dry.     Psychiatric: His mood appears anxious. His speech is delayed, tangential and slurred. Thought content is delusional. Cognition and memory are impaired. He is inattentive.  Nursing note and vitals reviewed.   ED Course  Procedures (including critical care time) Labs Review Labs Reviewed  COMPREHENSIVE METABOLIC PANEL - Abnormal; Notable for the following:    Glucose, Bld 129 (*)    BUN 24 (*)    Creatinine, Ser 1.54 (*)    GFR calc non Af Amer 43 (*)    GFR calc Af Amer 50 (*)    All other components within normal limits  LIPASE, BLOOD  BRAIN NATRIURETIC PEPTIDE  TROPONIN I  CBC WITH DIFFERENTIAL/PLATELET  URINALYSIS, ROUTINE W REFLEX MICROSCOPIC (NOT AT [facility])    Imaging Review Ct Head Wo Contrast  03/15/2016  CLINICAL DATA:  Frontal headache EXAM: CT HEAD WITHOUT CONTRAST TECHNIQUE: Contiguous axial images were obtained from the base of the skull through the vertex without intravenous contrast. COMPARISON:  08/28/2014 FINDINGS: No intracranial hemorrhage, mass effect or midline shift. No acute cortical infarction. Mild cerebral atrophy. No mass lesion is noted on this unenhanced scan. No skull fracture is noted. Mild atherosclerotic calcifications of carotid siphon. Ventricular size is stable from prior exam. Mastoid air cells are unremarkable. Mild mucosal thickening noted medial aspect of the right maxillary sinus. IMPRESSION: No acute intracranial abnormality. Mild cerebral atrophy. No definite acute cortical infarction. Electronically Signed   By: Lahoma Crocker M.D.   On: 03/15/2016 08:33   I have personally reviewed and evaluated these images and lab results as part of my medical decision-making.   Update:   EKG  Interpretation   Date/Time:  Thursday March 15 2016 07:25:39 EDT Ventricular Rate:  99 PR Interval:    QRS Duration: 69 QT Interval:  400 QTC Calculation: 514 R Axis:   23 Text Interpretation:  Sinus rhythm Abnormal R-wave progression, early  transition Borderline repolarization abnormality T wave abnormality  Abnormal ekg Confirmed by Carmin Muskrat  MD [phone]) on 03/15/2016 7:29:53  AM     12:19 PM I have had multiple conversations with social work, and the patient's son. On several repeat exams the patient remains delirious With reassuring findings, this seems to be progression of the patient's baseline disease, for his delirium on dementia, but with no acute evidence for new pathology. We arranged for evaluation for assisted living placement. Patient does not have a safety discharge until tomorrow, and placement is pending.  MDM  Elderly male presents after being found wandering, speaking  nonsensically in a fast food restaurant. Patient is a delirious, disoriented male, but in no distress. Patient has history of dementia, that today's presentation seems more acute. Initial studies reassuring, with no evidence for new infection, or joint abnormality. Symptoms may be progression of disease versus medication related. Eventually we contacted the patient's son, and with him discussed his disposition with social work. Patient is awaiting discharge, tomorrow, into a safe housing arrangement. Home medication ordered.  Carmin Muskrat, MD 03/15/16 1224

## 2016-03-15 NOTE — Progress Notes (Signed)
CSW engaged with Patient and [name] in Patient's room. Patient able to confirm year, date of birth, however, unable to identify name of current president and events leading to his ED visit. CSW discussed assisted living facility placement and special assistance medicaid to assist with paying for placement. CSW along with Patient and [name] discussed safe discharge planning. Patient's long time friend, [name] [phone]), will be coming in from [place] on tomorrow morning to stay with Patient until son is able to tour ALF's and get patient placed. CSW also assisted Patient and Patient's family in locating the [employer] that Patient was found wandering at so that family can go and [place]. CSW provided Patient and family with information regarding [facility], [facility], and Respite Care for family members caring for loved ones who have memory loss, Alzheimers, or requires supervision. [name] very appreciative of resources and assistance provided. MD notified. CSW will continue to follow for discharge.          Emiliano Dyer, LCSW [facility] ED/47M Clinical Social Worker [phone]

## 2016-03-15 NOTE — ED Notes (Signed)
Pt defecated in the trash can. Pt then transferred trashbag to biohazard bin. Pt assisted to the bathroom to allow him to self clean.

## 2016-03-15 NOTE — Telephone Encounter (Signed)
[place] Bells called, wanted to make sure that Dr. Nyoka Cowden knew his dad was in the hospital, that he was found wandering on [address], and he is hallucinating. Spoke with him about placement, he will talk with the social worker in the morning. Mr Poser's friend Tamela Oddi is coming to town tomorrow, she will help him with that.

## 2016-03-15 NOTE — Telephone Encounter (Signed)
Pt's ex- wife called.  Stated Logan Hayes was found in the neighborhood on today.  His son was called and patient was taken to the hospital.   Per notes from the hospital, pt treated in the ER to be released and taken to a safe place.  FYI to Dr. Nyoka Cowden..Carolin Coy

## 2016-03-15 NOTE — ED Notes (Signed)
Wound cleaned and dressed at left index finger.

## 2016-03-16 MED ORDER — HYDROCODONE-ACETAMINOPHEN 7.5-325 MG PO TABS
1.0000 | ORAL_TABLET | Freq: Four times a day (QID) | ORAL | Status: DC | PRN
  Administered 2016-03-16: 1 via ORAL
  Filled 2016-03-16: qty 1

## 2016-03-16 MED ORDER — LOPERAMIDE HCL 2 MG PO CAPS
2.0000 mg | ORAL_CAPSULE | ORAL | Status: DC | PRN
  Administered 2016-03-17: 2 mg via ORAL
  Filled 2016-03-16: qty 1

## 2016-03-16 MED ORDER — ALPRAZOLAM 0.25 MG PO TABS
1.0000 mg | ORAL_TABLET | Freq: Three times a day (TID) | ORAL | Status: DC | PRN
  Administered 2016-03-16 – 2016-03-17 (×2): 1 mg via ORAL
  Filled 2016-03-16: qty 2
  Filled 2016-03-16: qty 4

## 2016-03-16 NOTE — NC FL2 (Signed)
[place] LEVEL OF CARE SCREENING TOOL     IDENTIFICATION  Patient Name: Logan Hayes Birthdate: 12/12/1940 Sex: male Admission Date (Current Location): 03/15/2016  [facility] and [place] Number:  Herbalist and Address:  [facility], [place] [address], [place], [place] [address]      Provider Number: [id number]  Attending Physician Name and Address:  Provider Default, MD  Relative Name and Phone Number:       Current Level of Care: Hospital Recommended Level of Care: [place] Prior Approval Number:    Date Approved/Denied:   PASRR Number:  ([id number])  Discharge Plan: Domiciliary (Rest home)    Current Diagnoses: Patient Active Problem List   Diagnosis Date Noted  . Cellulitis 02/28/2016  . Monilial rash 02/28/2016  . Headache 10/19/2015  . OCD (obsessive compulsive disorder) 12/07/2014  . Hemorrhoid 08/04/2014  . Constipation 04/28/2014  . Seborrheic keratoses 03/02/2014  . Memory changes 10/02/2013  . Obesity 09/01/2013  . IBS (irritable bowel syndrome)   . Hyperlipidemia 04/04/2005  . [name], shoulder region 12/05/2001  . Anxiety 02/14/1999  . GERD (gastroesophageal reflux disease) 09/14/1998  . Depression 02/14/1972  . Tinnitus 07-24-1941    Orientation RESPIRATION BLADDER Height & Weight     Self  Normal Continent Weight: 190 lb (86.183 kg) Height:  5\' 9"  (175.3 cm)  BEHAVIORAL SYMPTOMS/MOOD NEUROLOGICAL BOWEL NUTRITION STATUS  Wanderer   Continent Diet  AMBULATORY STATUS COMMUNICATION OF NEEDS Skin   Independent Verbally Skin abrasions (left posterior index finger)                       Personal Care Assistance Level of Assistance  Bathing, Feeding, Dressing Bathing Assistance: Limited assistance Feeding assistance: Independent Dressing Assistance: Limited assistance     Functional Limitations Info  Sight, Hearing, Speech Sight Info: Adequate Hearing Info:  Adequate Speech Info: Adequate    SPECIAL CARE FACTORS FREQUENCY                       Contractures Contractures Info: Not present    Additional Factors Info  Code Status (full code)               Current Medications (03/16/2016):  This is the current hospital active medication list Current Facility-Administered Medications  Medication Dose Route Frequency Provider Last Rate Last Dose  . ALPRAZolam Duanne Moron) tablet 1 mg  1 mg Oral TID PRN Harvel Quale, MD      . DULoxetine (CYMBALTA) DR capsule 30 mg  30 mg Oral Daily Carmin Muskrat, MD   30 mg at 03/16/16 0950  . HYDROcodone-acetaminophen (NORCO) 7.5-325 MG per tablet 1 tablet  1 tablet Oral Q6H PRN Harvel Quale, MD      . loperamide (IMODIUM) capsule 2 mg  2 mg Oral PRN Orpah Greek, MD      . nystatin cream (MYCOSTATIN)   Topical BID Carmin Muskrat, MD      . rosuvastatin (CRESTOR) tablet 10 mg  10 mg Oral Daily Carmin Muskrat, MD   10 mg at 03/16/16 0951  . sulfamethoxazole-trimethoprim (BACTRIM DS,SEPTRA DS) 800-160 MG per tablet 1 tablet  1 tablet Oral Q12H Blanchie Dessert, MD   1 tablet at 03/16/16 1100   Current Outpatient Prescriptions  Medication Sig Dispense Refill  . ALPRAZolam (XANAX) 1 MG tablet Take 1 tablet (1 mg total) by mouth 3 (three) times daily as  needed for anxiety. 90 tablet 0  . HYDROcodone-acetaminophen (NORCO) 7.5-325 MG tablet Take 1 tablet by mouth every 6 (six) hours as needed. For pain 120 tablet 0  . rosuvastatin (CRESTOR) 10 MG tablet TAKE 1 TABLET BY MOUTH ONCE A DAY TO LOWER CHOLESTEROL (Patient taking differently: TAKE 1 TABLET (10 mg) BY MOUTH ONCE A DAY TO LOWER CHOLESTEROL) 30 tablet 5  . aspirin 81 MG tablet Take 81 mg by mouth daily. Take 1 tablet daily to prevent heart attack and stroke.    . DULoxetine (CYMBALTA) 30 MG capsule TAKE ONE CAPSULE BY MOUTH EVERY DAY TO HELP DEPRESSION AND ANXIETY 90 capsule 1  . fluconazole (DIFLUCAN) 100 MG tablet 1 tablet daily for  3 days for yeast infection 3 tablet 0  . nystatin cream (MYCOSTATIN) Apply twice daily to the yeast rash at scrotum ans groin (Patient taking differently: Apply 1 application topically 2 (two) times daily. For yeast rash at scrotum ans groin) 30 g 3  . sulfamethoxazole-trimethoprim (BACTRIM DS,SEPTRA DS) 800-160 MG tablet One twice daily to treat infection (Patient taking differently: Take 1 tablet by mouth 2 (two) times daily. ) 20 tablet 0     Discharge Medications: Please see discharge summary for a list of discharge medications.  Relevant Imaging Results:  Relevant Lab Results:   Additional Information    Davona Kinoshita M, LCSW

## 2016-03-16 NOTE — ED Notes (Signed)
Pts meal tray set up for pt.

## 2016-03-16 NOTE — ED Notes (Signed)
Family at bedside. 

## 2016-03-16 NOTE — Progress Notes (Signed)
APS report given to Elayne Guerin with [facility], Adult [employer]. CSW will continue to follow for disposition.      Emiliano Dyer, LCSW [facility] ED/21M Clinical Social Worker [phone]

## 2016-03-16 NOTE — ED Notes (Signed)
Pt removed IV while in the bathroom. Pt blood pool left in the bathroom on the floor as well as the sink. Pt left a blood trail in the hallway extending from the bathroom to the nurses station. Pt given a partial bath, linen an gown change.   Pt slightly agitated and shaking. Pt redirected on several occasions.

## 2016-03-16 NOTE — ED Notes (Signed)
Attempted to contact friend, Delores as to time-frame of her arrival here.  No answer, no voice mail.

## 2016-03-16 NOTE — Progress Notes (Signed)
Lengthy discussion with Logan Hayes's son and his friend Logan Hayes this pm.  Logan Hayes can no longer care or himself at home and requires 24/7 assistance/supervision.  Logan Hayes's son, Logan Hayes, has been in contact with [facility] in [place] and they have a bed available for Logan Hayes pending review of Logan Hayes's FL2 and placement of a PPD skin test.  At this time, TTS c/s is pending.  If [facility] Logan placement is not recommended, son agreeable to take Logan Hayes home until placement can be secured at the ALF on 03/19/16, as they cannot admit on the w/e.  FL2 has been prepared and [place] has been filed.  ED CSW will fax FL2 to facilty over the w/e so that they can review it on Monday am.  CSW will continue to follow for disposition.

## 2016-03-16 NOTE — ED Notes (Signed)
Ordered diet tray 

## 2016-03-16 NOTE — ED Notes (Signed)
RN spoke with son advised he will not be looking for placement and will not be taking the patient home. Advised SW stated APS report will be made.

## 2016-03-16 NOTE — ED Notes (Signed)
Gave pt vanilla ice cream. Dorian Pod - RN aware.

## 2016-03-16 NOTE — ED Notes (Signed)
Patient is in the process being transported from B17 to C23. Nurse stated patient son arrived and at bedside.

## 2016-03-16 NOTE — Care Management (Signed)
ED CM and CSW met with son Olumide Dolinger to discuss discharge plan. Patient needs 24 hour supervision, and family states they are not able to provide that at this time. Son reports having a plan for ALF but facility unble to admit patient on the weekend. Family unable to provide safety for patient at home. CM spoke with Dr. Nyoka Cowden [phone] patient's PCP who states,  He saw patient several weeks ago and felt he was not at his baseline. Patient has become more agitated, and delusional.  Patient was claiming that he that people are removing things from his room, he was refusing to go back to room. TTS consult was ordered and pending.

## 2016-03-16 NOTE — ED Notes (Signed)
[name] states [name] is coming to the ED to assist son with father. States Delores and son need assistance finding placement for patient.  States Delores and son unable to take patient home need to find placement.

## 2016-03-16 NOTE — ED Notes (Signed)
RN and [place] with son advised patient is being discharge. Caryl Pina advised she spoke with her supervisor and unable to keep patient here for long-term placement.  Son explained wife is on the way, and a family friend is on the way to help make a decision on care. Son states he would rather private pay. CSW advised him to call resources given for placement. RN advised son we will give to 6pm for family to make a decision on placement. Son agreeable to plan.

## 2016-03-16 NOTE — ED Notes (Signed)
Son at bedside, reports he is not taking him home with him or to pt's home.  Son states "we've got to find a place for him today or the hospital will have to take him".  When asked about Delores coming to get pt, son states "that wasn't the deal, she's coming some time today".

## 2016-03-16 NOTE — ED Notes (Signed)
Son at bedside.

## 2016-03-16 NOTE — ED Notes (Signed)
Spoke with Education officer, museum regarding family plan.  Social Worker at bedside speaking with patient and son.

## 2016-03-16 NOTE — Progress Notes (Signed)
CSW engaged with Patient and [name] re: disposition. [name] is now stating that Logan Hayes cannot care for Patient. CSW explained to Patient and [name] that per the plan yesterday, Patient's long time friend, [name] [phone]), would be coming in from [place] on this morning to stay with Patient until son is able to tour ALF's and get patient placed. [name] reports that is looking at a facility in [place] and would like facilities in [place]. CSW provided [name] with a list of facilities in [facility] and advised him to contact these facilities regarding bed availability. CSW also emphasized that Patient will have to pay privately until he is able to get Long Term Medicaid. CSW explained that Patient is unable to remain in the ED awaiting ALF placement as he has been medically cleared and discharged. CSW also inquired about additional family that can assist until Placement is found. [name] denies any family members who can assist. CSW was instructed by [place] Surveyor, quantity that Patient has been discharged and family will need to seek out placement from the community. CSW Surveyor, quantity instructed CSW to contact Adult Protective Services if family continues to refuse to take Patient home which is deemed neglect. RN initially advised son we will give to 6pm for family to make a decision on placement or adult protective services would have to be contacted however, [name] reports that he was informed by Patient's PCP to have ED contact adult protective services. APS report attempted. Voice message left. Awaiting return phone. CSW will continue to follow for disposition.          Logan Dyer, LCSW [facility] ED/41M Clinical Social Worker [phone]

## 2016-03-16 NOTE — ED Notes (Signed)
Called Adult Protective Services left voicemail to call CSW Caryl Pina or Jeral Fruit at [phone]

## 2016-03-17 NOTE — Progress Notes (Signed)
CSW has faxed pt FL2 and Facesheet to [facility].     Lendon Collar, [place] ED Clinical Social Worker [phone]

## 2016-03-17 NOTE — Progress Notes (Addendum)
CM received call from Gean Birchwood, Representative from [facility] [phone]. She was inquiring about more information re: admission to locked facility. She reports that she has received calls from the son who is at the bedside inquiring about terms of admission. Apparently he is being assessed by [facility] today. CM has spoken with Alison Murray CSW in the ED. SW is working on this case. FL2 has been completed and faxed out to facilities per CSW note 03/16/2016. Family is unable to provide 24/7 care for this pt and will need Private duty List if unsuitable for inpt Psych services. No further CM needs at this time, but will be available should additional needs arise.

## 2016-03-17 NOTE — ED Notes (Signed)
Son at bedside given update

## 2016-03-17 NOTE — BH Assessment (Signed)
Called [facility] to arrange tele-assessment. Spoke to [place], RN who said Pt is asleep at this time and waking him would not be in Pt's best interest. She will call when Pt is ready for assessment.   Orpah Greek Anson Fret, LPC, [facility], [facility] Triage Specialist [phone]

## 2016-03-17 NOTE — ED Notes (Signed)
Pt given word search

## 2016-03-17 NOTE — BH Assessment (Addendum)
Tele Assessment Note   Logan Hayes is an 75 y.o. male who was brought to the Emergency Department after being found "wandering around [employer] on Thursday". Pt was not able to answer questions accurately due to issues with memory and recall. Pt was, however, pleasant with Probation officer and said that he was "doing well today". He denies SI, HI or A/V hallucinations at this time and has no previous psychiatric history noted or disclosed by him or family member. Son was in the room and was available to speak to clinician to finish assessment. He states that his Dad has been having some "dementia related symptoms" for the past 2 years but has been able to live on his own up until this incident. He currently lives in a residence by himself and has been able to take his medications and keep his doctors appointments well per son's report. He currently sees Dr. Nyoka Cowden which is his primary care physician. He has also been seeing a neurologist but son could not remember the name of the doctor. Son states that when he was found at the [employer] he was combative, confused and scared. He continued this way until he woke up this morning. Son states this is as "clear as he has seen him" in a couple days. Son is currently looking for assisted living placement for the pt and states that he has been talking to [facility] in [place] and they may be able to interview him today. He states that he can not take him back home to wait for placement because he has had water issues in his house and there is mold. He also states that he will not have enough time to look after him if he were to go home with him. Pt has no prior history of substance abuse, denies using substances currently. Son is the main support person for him and does not have other family that can care for him.   Disposition: Pt is cleared psychiatrically and does not meet inpatient criteria based on assessment per Elmarie Shiley NP. Social work will need to follow  up to help with placement. EDP notified by CSW.   Diagnosis: 331.9 Major neurocognitive disorder, possible.  Past Medical History:  Past Medical History  Diagnosis Date  . Anxiety 02/14/1999  . Arthritis   . GERD (gastroesophageal reflux disease) 09/14/1998  . Glaucoma 02/13/2006  . Hyperlipidemia 04/04/2005  . Chronic joint pain   . IBS (irritable bowel syndrome)   . Erosive esophagitis   . Hiatal hernia   . Internal hemorrhoid   . Adenomatous polyp of colon 04/2004  . [name], shoulder region 12/05/2001  . Tension headache 02/13/1994  . Depressive disorder, not elsewhere classified 02/14/1972  . Unspecified tinnitus 17-Aug-1941  . Unspecified vitamin D deficiency   . Hypertrophy of prostate with urinary obstruction and other lower urinary tract symptoms (LUTS)   . Impotence of organic origin   . Insomnia, unspecified   . Attention or concentration deficit   . Tinnitus 1980  . Headache 10/19/2015    Past Surgical History  Procedure Laterality Date  . Ankle fracture surgery Left 2002    Dr. Gladstone Lighter  . Rotator cuff repair      left  . Vasectomy  1970  . Kidney stone surgery  1973    Family History:  Family History  Problem Relation Age of Onset  . Colon cancer Neg Hx   . Stomach cancer Neg Hx   . Diabetes Mother   .  Leukemia Mother   . Heart disease Father     Social History:  reports that he quit smoking about 41 years ago. He has never used smokeless tobacco. He reports that he does not drink alcohol or use illicit drugs.  Additional Social History:  Alcohol / Drug Use History of alcohol / drug use?: No history of alcohol / drug abuse  CIWA: CIWA-Ar BP: 159/86 mmHg Pulse Rate: 92 COWS:    PATIENT STRENGTHS: (choose at least two) Average or above average intelligence Supportive family/friends  Allergies:  Allergies  Allergen Reactions  . Donepezil Anaphylaxis    Made memory worse  . Luvox [Fluvoxamine]     Felt worse  . Mirtazapine Other (See  Comments)    Restless, weak    Home Medications:  (Not in a hospital admission)  OB/GYN Status:  No LMP for male patient.  General Assessment Data Location of Assessment: [facility] ED TTS Assessment: In system Is this a Tele or Face-to-Face Assessment?: Tele Assessment Is this an Initial Assessment or a Re-assessment for this encounter?: Initial Assessment Marital status: Single Is patient pregnant?: No Pregnancy Status: No Living Arrangements: Alone Can pt return to current living arrangement?: No Admission Status: Voluntary Is patient capable of signing voluntary admission?: No Referral Source: Self/Family/Friend Insurance type:  Nurse, mental health)     Crisis Care Plan Living Arrangements: Alone Legal Guardian:  (None at moment) Name of Psychiatrist: Dr, Nyoka Cowden- PCP Name of Therapist: None  Education Status Is patient currently in school?: No Highest grade of school patient has completed: Unknown  Risk to self with the past 6 months Suicidal Ideation: No Has patient been a risk to self within the past 6 months prior to admission? : No Suicidal Intent: No Has patient had any suicidal intent within the past 6 months prior to admission? : No Is patient at risk for suicide?: No Suicidal Plan?: No Has patient had any suicidal plan within the past 6 months prior to admission? : No Access to Means: No What has been your use of drugs/alcohol within the last 12 months?: none Previous Attempts/Gestures: No How many times?: 0 Other Self Harm Risks: confused and disoriented  Triggers for Past Attempts: None known Intentional Self Injurious Behavior: None Family Suicide History: No Recent stressful life event(s): Other (Comment) Persecutory voices/beliefs?: No Depression: No Substance abuse history and/or treatment for substance abuse?: No Suicide prevention information given to non-admitted patients: Not applicable  Risk to Others within the past 6 months Homicidal Ideation: No Does  patient have any lifetime risk of violence toward others beyond the six months prior to admission? : No Thoughts of Harm to Others: No Current Homicidal Intent: No Current Homicidal Plan: No Access to Homicidal Means: No Identified Victim: none History of harm to others?: No Assessment of Violence: On admission Violent Behavior Description: angry and confused and scared Does patient have access to weapons?: No Criminal Charges Pending?: No Does patient have a court date: No Is patient on probation?: No  Psychosis Hallucinations: Auditory, Visual Delusions: Unspecified  Mental Status Report Appearance/Hygiene: Bizarre Eye Contact: Good Motor Activity: Freedom of movement Speech: Logical/coherent Level of Consciousness: Alert Mood: Pleasant Affect: Appropriate to circumstance Anxiety Level: Minimal Thought Processes: Irrelevant Judgement: Impaired Orientation: Person, Place Obsessive Compulsive Thoughts/Behaviors: Unable to Assess  Cognitive Functioning Concentration: Poor Memory: Recent Impaired, Remote Impaired IQ: Average Insight: Poor Impulse Control: Poor Appetite: Fair Weight Loss: 0 Weight Gain: 0 Sleep: Decreased Total Hours of Sleep:  (UTA- pt poor historian) Vegetative Symptoms:  None  ADLScreening [facility] Assessment Services) Patient's cognitive ability adequate to safely complete daily activities?: No Patient able to express need for assistance with ADLs?: Yes Independently performs ADLs?: Yes (appropriate for developmental age)  Prior Inpatient Therapy Prior Inpatient Therapy: No  Prior Outpatient Therapy Prior Outpatient Therapy: No Does patient have an ACCT team?: No Does patient have Intensive In-House Services?  : No Does patient have [facility] services? : No Does patient have P4CC services?: No  ADL Screening (condition at time of admission) Patient's cognitive ability adequate to safely complete daily activities?: No Is the patient deaf or have  difficulty hearing?: No Does the patient have difficulty seeing, even when wearing glasses/contacts?: No Does the patient have difficulty concentrating, remembering, or making decisions?: Yes Patient able to express need for assistance with ADLs?: Yes Does the patient have difficulty dressing or bathing?: No Independently performs ADLs?: Yes (appropriate for developmental age) Does the patient have difficulty walking or climbing stairs?: No Weakness of Legs: None Weakness of Arms/Hands: None  Home Assistive Devices/Equipment Home Assistive Devices/Equipment: None  Therapy Consults (therapy consults require a physician order) PT Evaluation Needed: No OT Evalulation Needed: No SLP Evaluation Needed: No Abuse/Neglect Assessment (Assessment to be complete while patient is alone) Physical Abuse:  (UTA pt not oriented) Values / Beliefs Cultural Requests During Hospitalization: None Spiritual Requests During Hospitalization: None Consults Spiritual Care Consult Needed: No Social Work Consult Needed: Yes (Comment) (Needs assisted living placement) Advance Directives (For Healthcare) Does patient have an advance directive?: No Would patient like information on creating an advanced directive?: Yes - Educational materials given (per chart history ) Nutrition Screen- [facility] Adult/[facility]/AP Patient's home diet: Regular Has the patient recently lost weight without trying?: No Has the patient been eating poorly because of a decreased appetite?: No Malnutrition Screening Tool Score: 0  Additional Information 1:1 In Past 12 Months?: No CIRT Risk: No Elopement Risk: Yes Does patient have medical clearance?: Yes     Disposition:  Disposition Initial Assessment Completed for this Encounter: Yes Disposition of Patient: Other dispositions Other disposition(s):  (Pt is psychiatrically cleared and is a social work placement)  [place] 03/17/2016 10:18 AM

## 2016-03-17 NOTE — ED Notes (Signed)
TTS machine at bedside. 

## 2016-03-17 NOTE — Discharge Instructions (Signed)
You have been medically and psychiatrically cleared. Follow-up with your primary care doctor. Return here for new concerns.

## 2016-03-17 NOTE — ED Provider Notes (Signed)
10:47 AM Patient medically cleared 2 days ago by ED provider, has been psychiatrically cleared this morning by TTS-- see their note for full details. Does not meet inpatient criteria.  He will be going to [facility].  His son will take him there now.  His VSS.  Encouraged to follow-up with primary care doctor.  Return precautions given for any new/worsening symptoms.  Larene Pickett, PA-C 03/17/16 [id number]

## 2016-03-18 NOTE — Telephone Encounter (Signed)
I returned calls to Logan Hayes son on Friday and Sat. I spoke to Er personnel as well. Patient was transferred on Sat to an Assisted Living facility in [place], [place]. I called in prescriptions to the [facility] in [place] for him.

## 2016-03-21 IMAGING — CR DG ABDOMEN 2V
2 series · 2 of 2 positions shown · non-contrast
Comparison: 05/17/2005 CT

CLINICAL DATA: Constipation for 2.5 weeks. Generalized abdominal
pain for days. History of kidney stones surgery.Other constipation
8S3.E3 (EXY-8L-CM)Other constipation 8S3.E3 (EXY-8L-CM)

EXAM:
ABDOMEN - 2 VIEW

[view not recorded (1 of 2)]
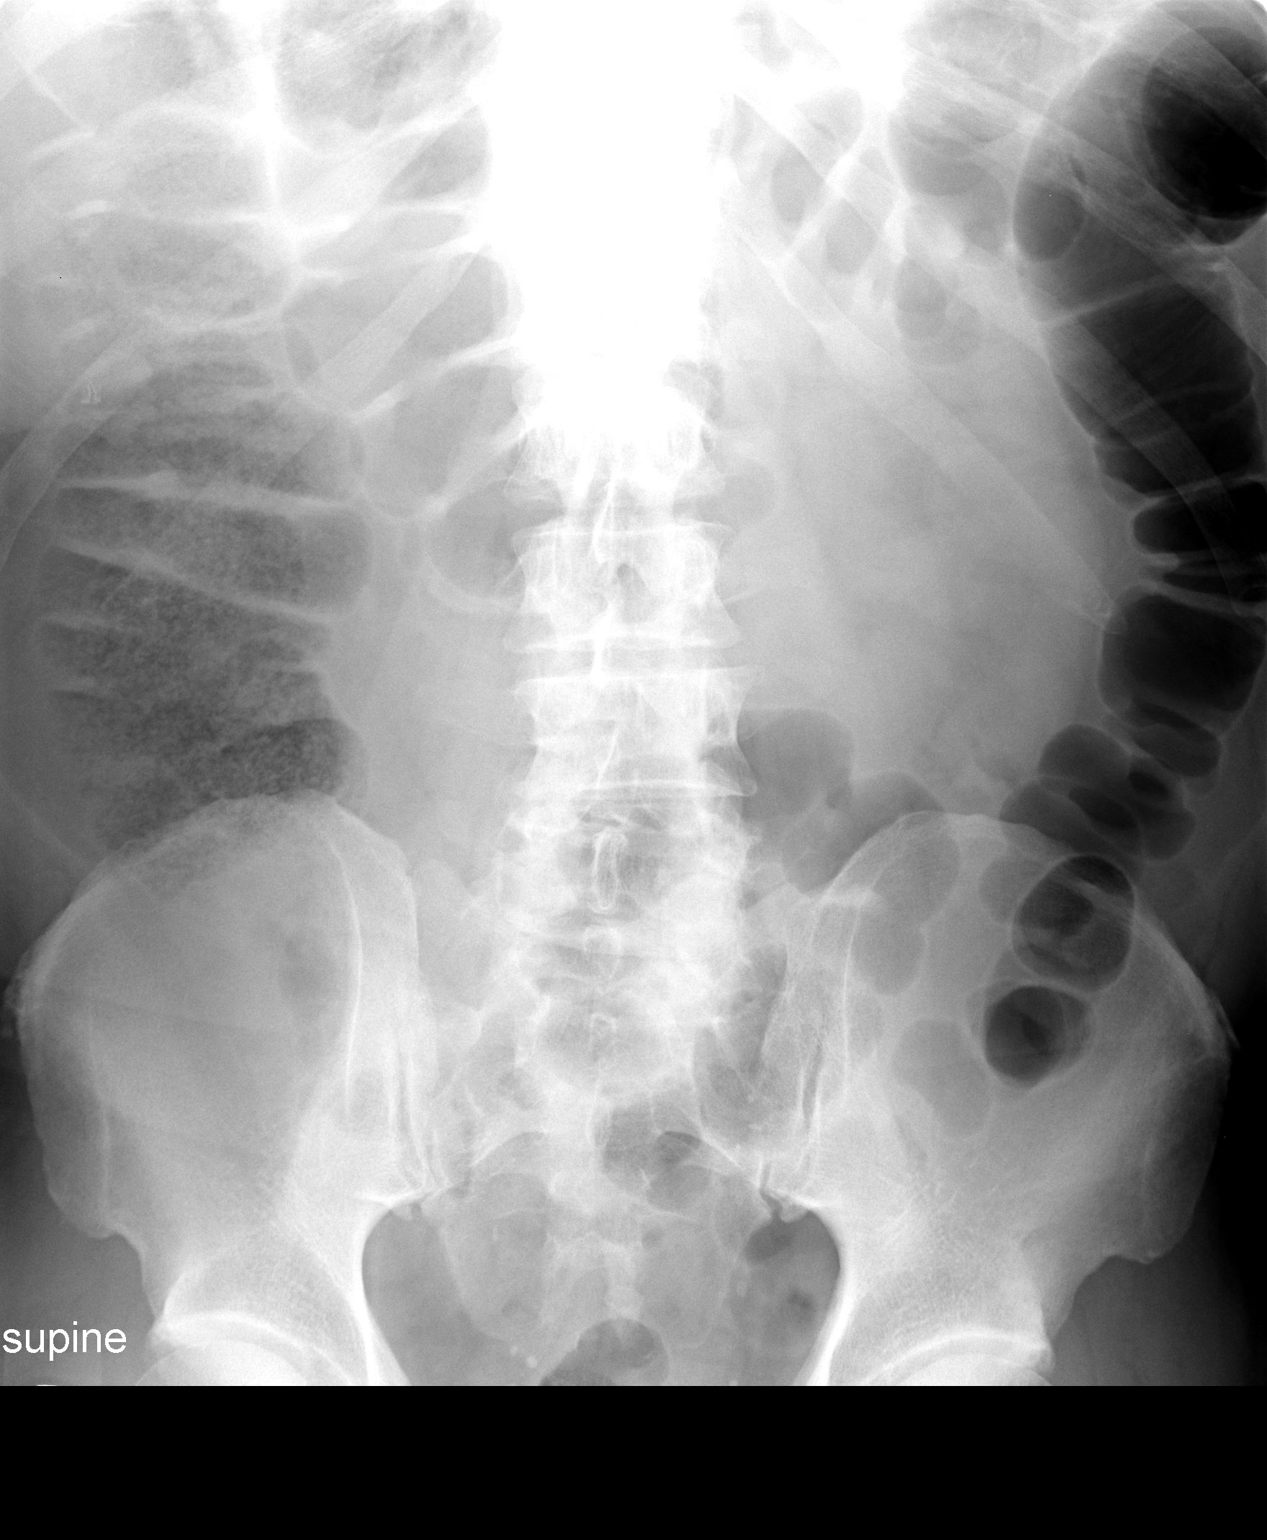

[view not recorded (2 of 2)]
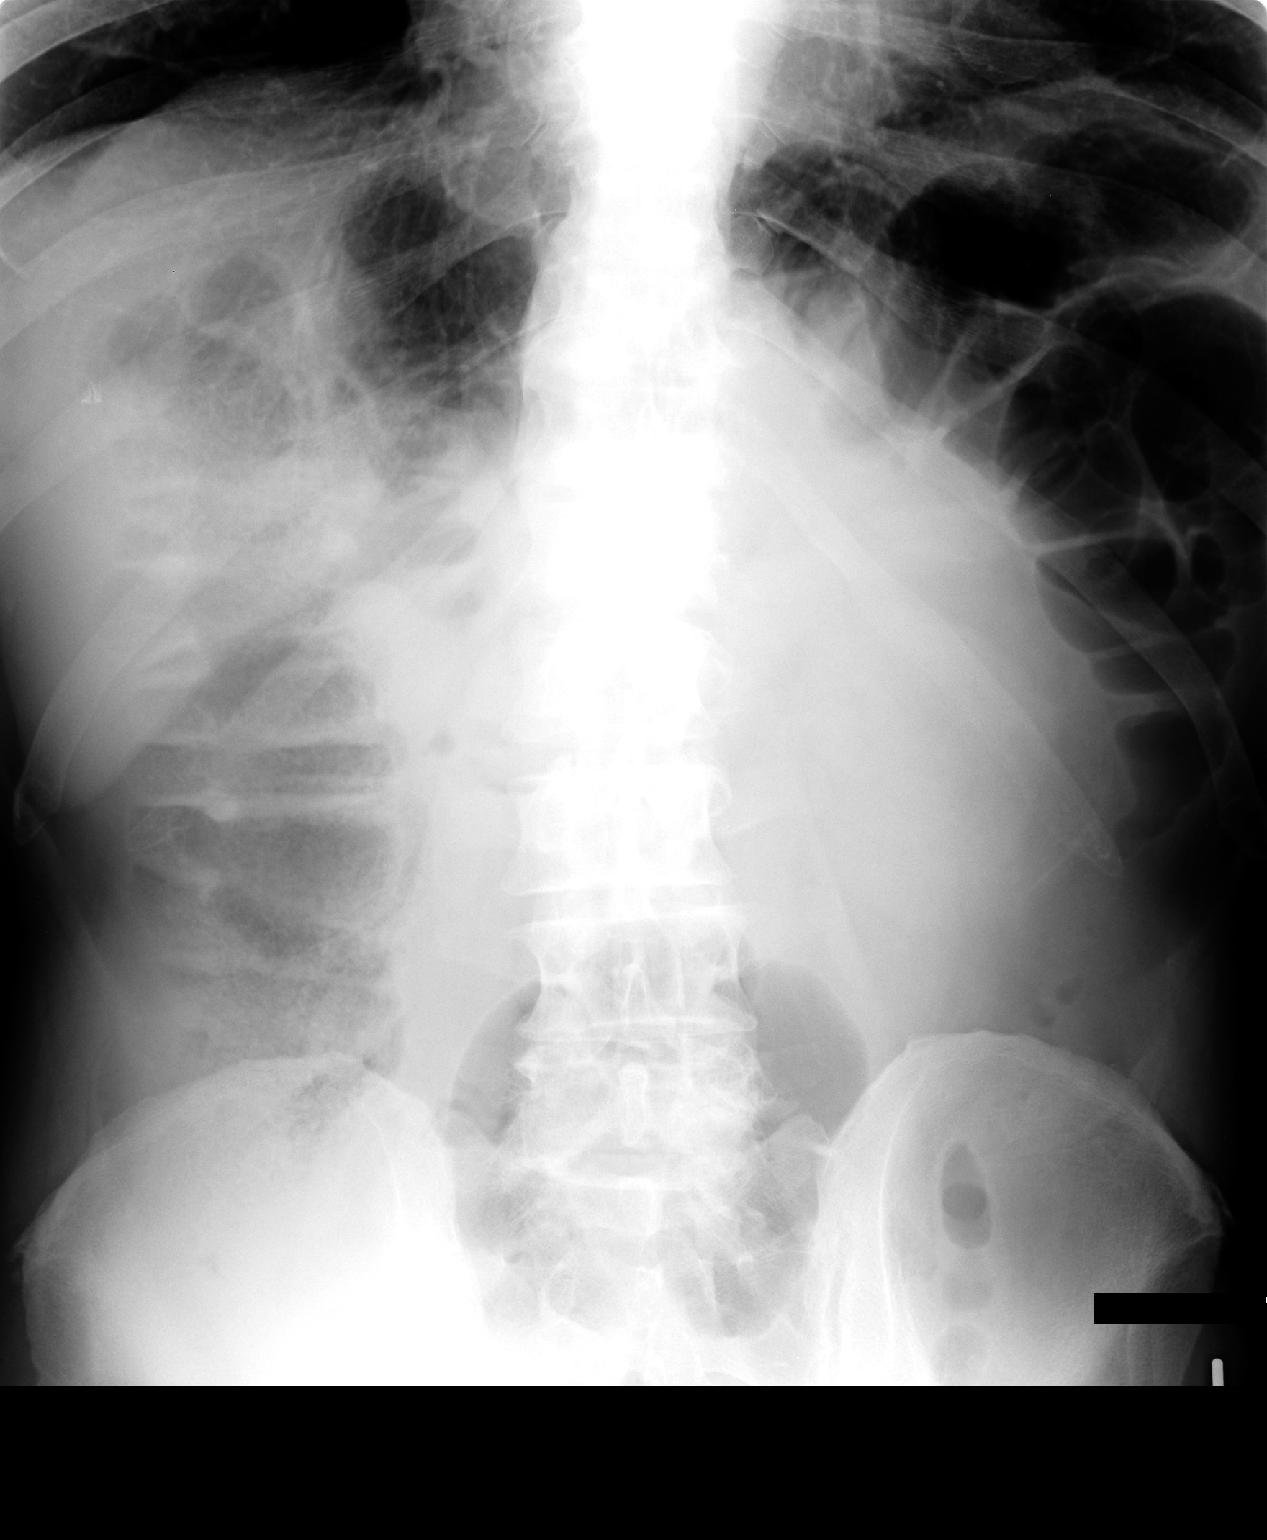

[2 of 2 positions shown; findings below may reference images not displayed]

FINDINGS: Supine and upright views. This supine view demonstrates a moderate
amount of ascending colonic stool. Distal gas and stool. Probable
phleboliths in the pelvis. The upright view demonstrates no
obstruction or free intraperitoneal air.
IMPRESSION: Possible constipation.

No acute findings.

## 2016-04-10 ENCOUNTER — Ambulatory Visit: Payer: Medicare Other | Admitting: Diagnostic Neuroimaging

## 2016-04-11 ENCOUNTER — Encounter: Payer: Self-pay | Admitting: Diagnostic Neuroimaging

## 2016-04-17 ENCOUNTER — Encounter: Payer: Medicare Other | Admitting: Internal Medicine

## 2016-04-17 ENCOUNTER — Telehealth: Payer: Self-pay | Admitting: Diagnostic Neuroimaging

## 2016-04-17 NOTE — Telephone Encounter (Signed)
Pt's son called said the pt had an episode around 7/13/ or 7/14 with confusion. He said the pt was lost in [place] for 3 hrs. He said he has placed his father in a memory loss center, he will not be returning to the clinic. FYI

## 2016-06-19 ENCOUNTER — Ambulatory Visit: Payer: Medicare Other | Admitting: Internal Medicine

## 2017-01-22 ENCOUNTER — Encounter: Payer: Self-pay | Admitting: Gastroenterology

## 2017-08-29 MED ORDER — LORAZEPAM 2 MG/ML IJ SOLN
1.00 | INTRAMUSCULAR | Status: DC
Start: ? — End: 2017-08-29

## 2017-08-29 MED ORDER — PANTOPRAZOLE SODIUM 20 MG PO TBEC
20.00 | DELAYED_RELEASE_TABLET | ORAL | Status: DC
Start: 2017-08-30 — End: 2017-08-29

## 2017-08-29 MED ORDER — QUETIAPINE FUMARATE 25 MG PO TABS
75.00 | ORAL_TABLET | ORAL | Status: DC
Start: 2017-08-30 — End: 2017-08-29

## 2017-08-29 MED ORDER — GENERIC EXTERNAL MEDICATION
Status: DC
Start: ? — End: 2017-08-29

## 2017-08-29 MED ORDER — ANTACID & ANTIGAS 200-200-20 MG/5ML PO SUSP
30.00 | ORAL | Status: DC
Start: ? — End: 2017-08-29

## 2017-08-29 MED ORDER — DIPHENHYDRAMINE HCL 25 MG PO CAPS
25.00 | ORAL_CAPSULE | ORAL | Status: DC
Start: ? — End: 2017-08-29

## 2017-08-29 MED ORDER — ASPIRIN 81 MG PO CHEW
81.00 | CHEWABLE_TABLET | ORAL | Status: DC
Start: 2017-08-30 — End: 2017-08-29

## 2017-08-29 MED ORDER — SENNA 8.6 MG PO TABS
ORAL_TABLET | ORAL | Status: DC
Start: ? — End: 2017-08-29

## 2017-08-29 MED ORDER — DIVALPROEX SODIUM 125 MG PO DR TAB
250.00 | DELAYED_RELEASE_TABLET | ORAL | Status: DC
Start: 2017-08-30 — End: 2017-08-29

## 2017-08-29 MED ORDER — HYDROXYZINE PAMOATE 25 MG PO CAPS
25.00 | ORAL_CAPSULE | ORAL | Status: DC
Start: ? — End: 2017-08-29

## 2017-08-29 MED ORDER — HYDROCODONE-ACETAMINOPHEN 7.5-325 MG PO TABS
ORAL_TABLET | ORAL | Status: DC
Start: ? — End: 2017-08-29

## 2017-08-29 MED ORDER — TRAZODONE HCL 50 MG PO TABS
75.00 | ORAL_TABLET | ORAL | Status: DC
Start: ? — End: 2017-08-29

## 2017-08-29 MED ORDER — ATORVASTATIN CALCIUM 40 MG PO TABS
40.00 | ORAL_TABLET | ORAL | Status: DC
Start: 2017-08-30 — End: 2017-08-29

## 2017-08-29 MED ORDER — ALPRAZOLAM 0.25 MG PO TABS
0.50 | ORAL_TABLET | ORAL | Status: DC
Start: 2017-08-29 — End: 2017-08-29

## 2017-08-29 MED ORDER — SERTRALINE HCL 50 MG PO TABS
75.00 | ORAL_TABLET | ORAL | Status: DC
Start: 2017-08-30 — End: 2017-08-29

## 2017-08-29 MED ORDER — LACTULOSE 10 GM/15ML PO SOLN
20.00 | ORAL | Status: DC
Start: 2017-08-30 — End: 2017-08-29

## 2017-10-07 NOTE — Telephone Encounter (Signed)
Pt transfer to another Nursing [facility]...[facility]

## 2017-11-23 IMAGING — CT CT HEAD W/O CM
3 of 4 series · 17 of 47 positions shown, 20 images · non-contrast
Comparison: 08/28/2014

CLINICAL DATA: Frontal headache

EXAM:
CT HEAD WITHOUT CONTRAST
TECHNIQUE: Contiguous axial images were obtained from the base of the skull
through the vertex without intravenous contrast.

[Series 201: head w/o, idose (1) · axial · non-contrast · 0.49mm/px · z∈[+73,+193]mm · 11 of 30 slices shown, 14 images]
[im 3/30  brain]
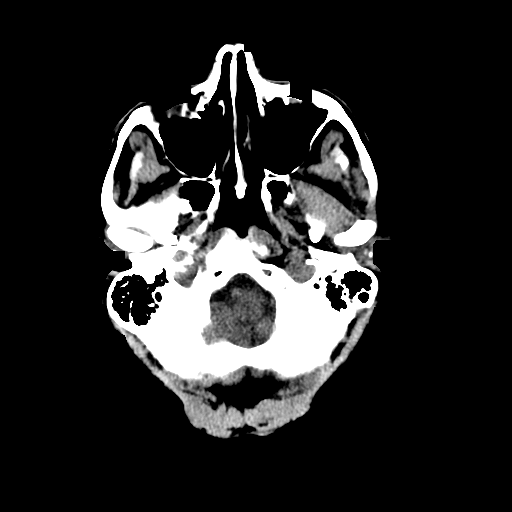
[im 3/30  bone]
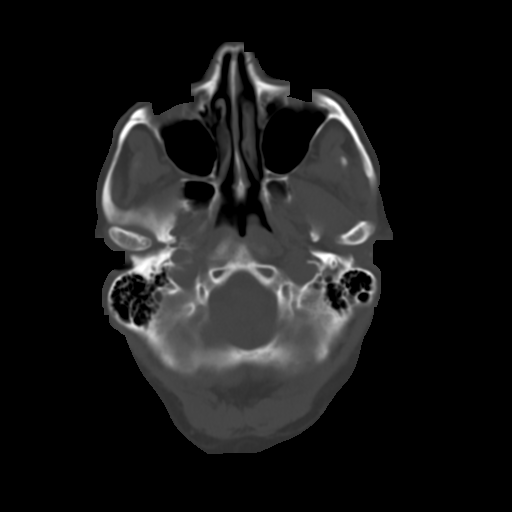
[im 5/30  brain]
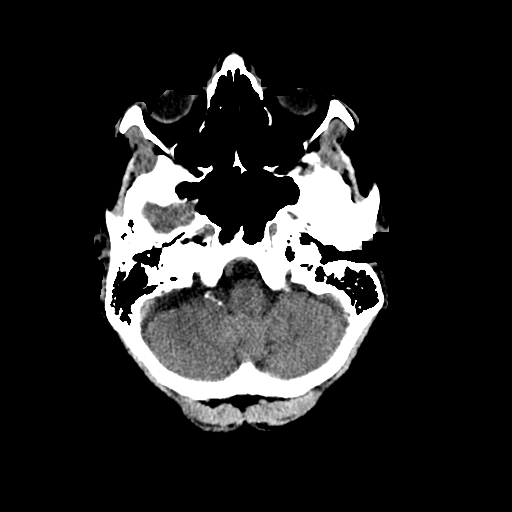
[im 7/30  brain]
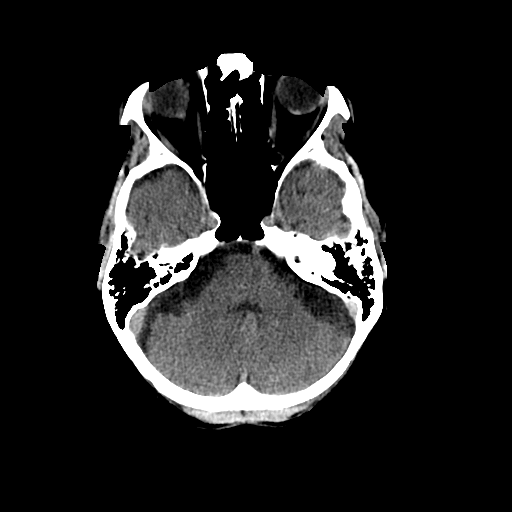
[im 11/30  brain]
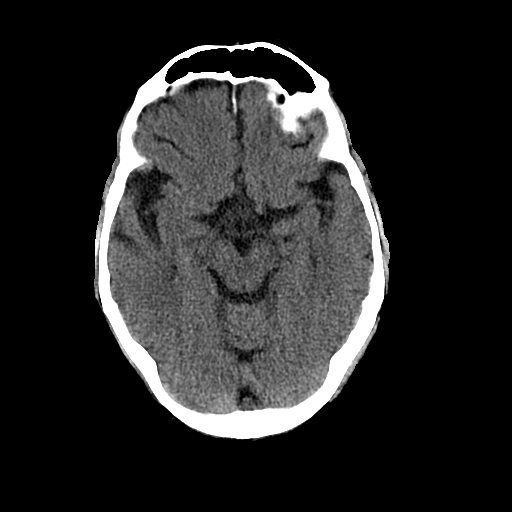
[im 13/30  brain]
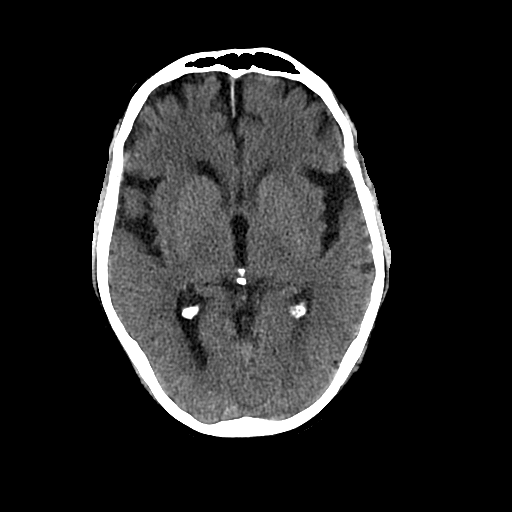
[im 13/30  bone]
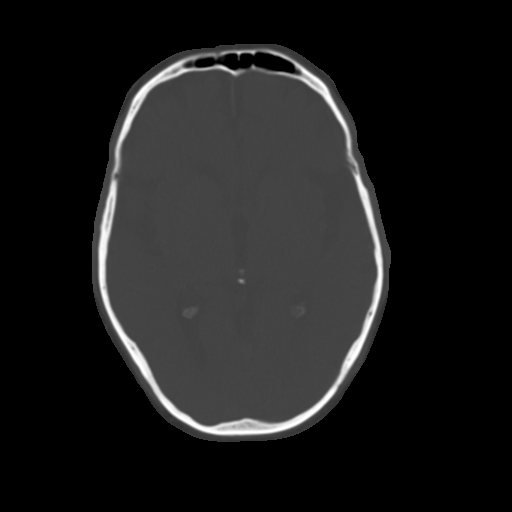
[im 15/30  brain]
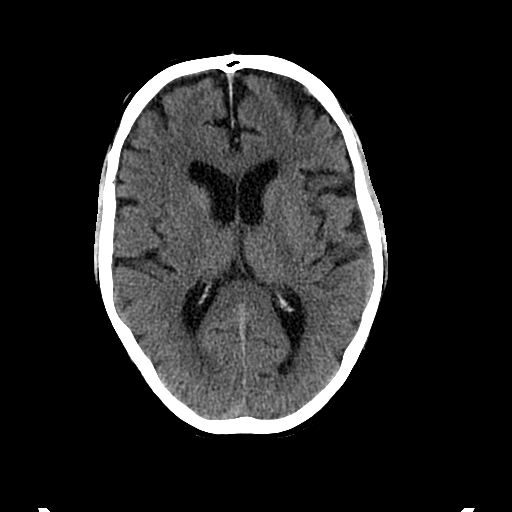
[im 17/30  brain]
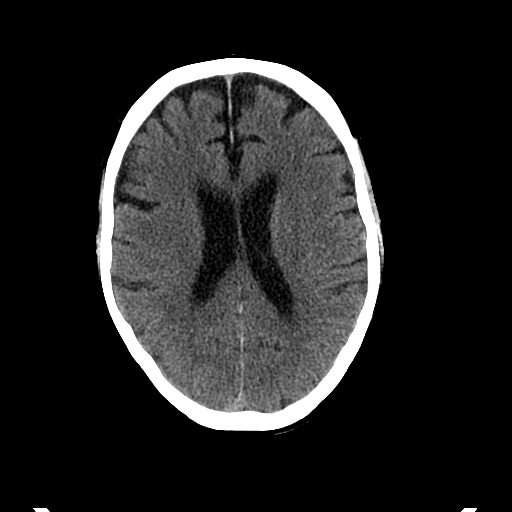
[im 19/30  brain]
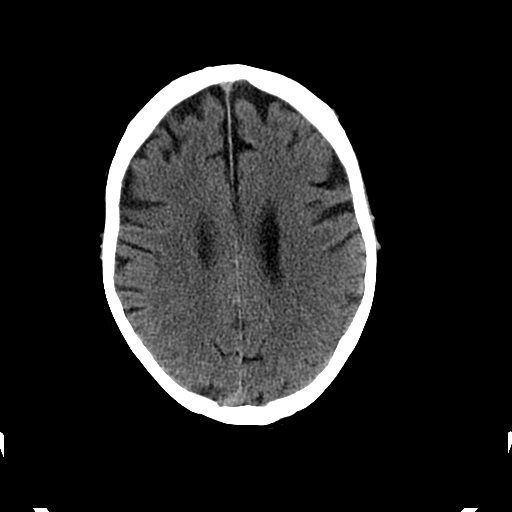
[im 23/30  brain]
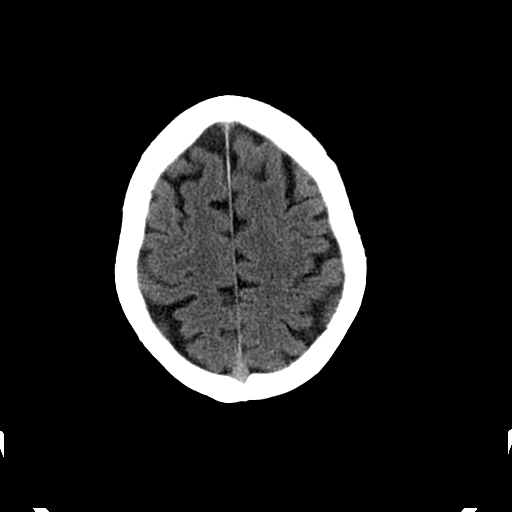
[im 23/30  bone]
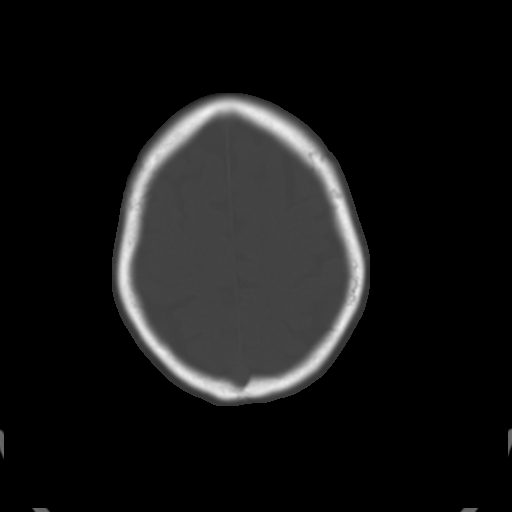
[im 25/30  brain]
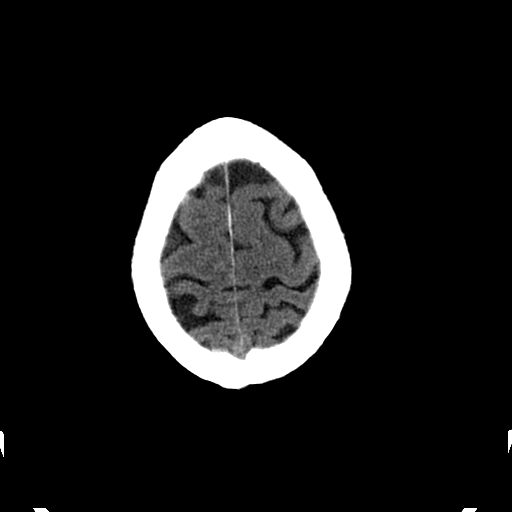
[im 27/30  brain]
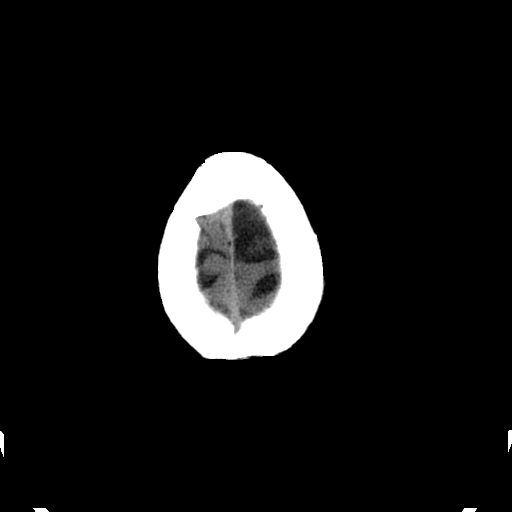

[Series 203: coronal st, idose (1) · coronal · 0.40mm/px · 3 of 78 slices shown]
[im 26/78  brain]
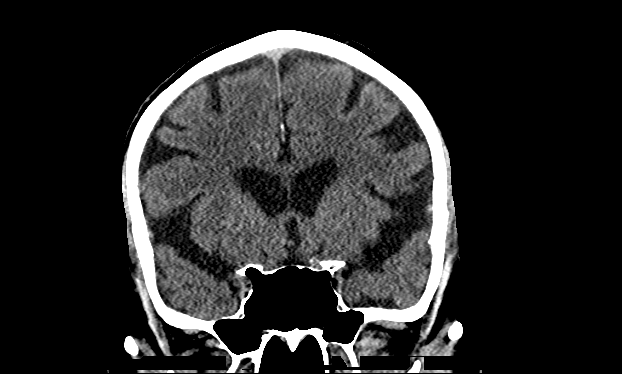
[im 35/78  brain]
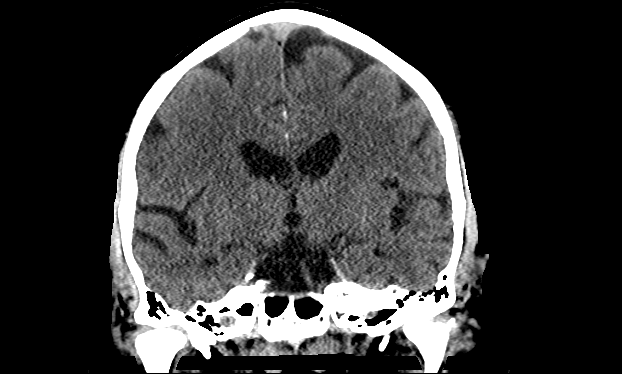
[im 43/78  brain]
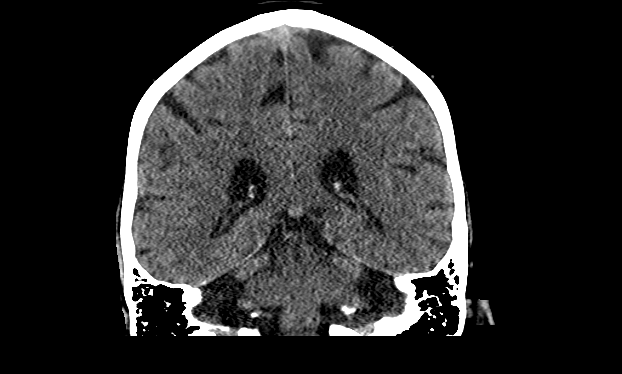

[Series 204: sagittal st, idose (1) · sagittal · 0.40mm/px · 3 of 83 slices shown]
[im 28/83  brain]
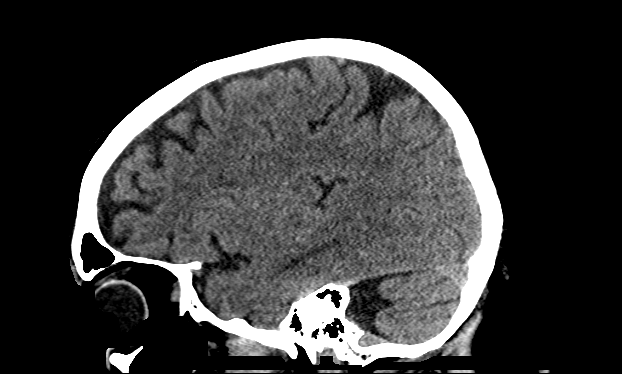
[im 42/83  brain]
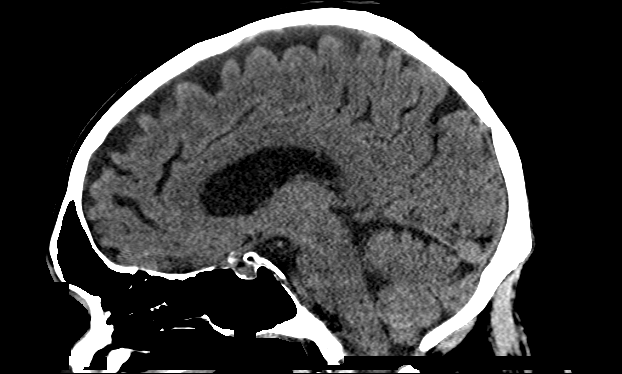
[im 55/83  brain]
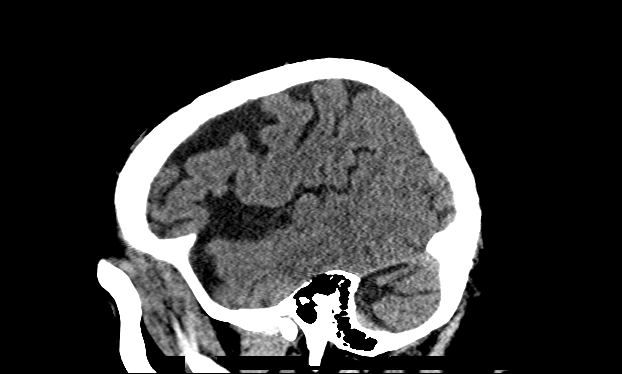

[17 of 47 positions shown; findings below may reference images not displayed]

FINDINGS: No intracranial hemorrhage, mass effect or midline shift. No acute
cortical infarction. Mild cerebral atrophy. No mass lesion is noted
on this unenhanced scan.

No skull fracture is noted. Mild atherosclerotic calcifications of
carotid siphon.

Ventricular size is stable from prior exam. Mastoid air cells are
unremarkable. Mild mucosal thickening noted medial aspect of the
right maxillary sinus.
IMPRESSION: No acute intracranial abnormality. Mild cerebral atrophy. No
definite acute cortical infarction.

## 2022-03-03 DEATH — deceased
